# Patient Record
Sex: Female | Born: 1960
Health system: Southern US, Community
[De-identification: ages and names within clinical notes are randomized; demographics above are authoritative.]

## PROBLEM LIST (undated history)

## (undated) DIAGNOSIS — Z789 Other specified health status: Secondary | ICD-10-CM

## (undated) DIAGNOSIS — Z9889 Other specified postprocedural states: Secondary | ICD-10-CM

## (undated) DIAGNOSIS — R112 Nausea with vomiting, unspecified: Secondary | ICD-10-CM

## (undated) DIAGNOSIS — K219 Gastro-esophageal reflux disease without esophagitis: Secondary | ICD-10-CM

## (undated) HISTORY — DX: Gastro-esophageal reflux disease without esophagitis: K21.9

---

## 1988-11-03 HISTORY — PX: DIAGNOSTIC LAPAROSCOPY: SUR761

## 1999-07-25 ENCOUNTER — Other Ambulatory Visit: Admission: RE | Admit: 1999-07-25 | Discharge: 1999-07-25 | Payer: Self-pay | Admitting: Obstetrics and Gynecology

## 1999-10-01 ENCOUNTER — Other Ambulatory Visit: Admission: RE | Admit: 1999-10-01 | Discharge: 1999-10-01 | Payer: Self-pay | Admitting: Obstetrics and Gynecology

## 1999-10-01 ENCOUNTER — Encounter (INDEPENDENT_AMBULATORY_CARE_PROVIDER_SITE_OTHER): Payer: Self-pay | Admitting: Specialist

## 2000-03-03 ENCOUNTER — Other Ambulatory Visit: Admission: RE | Admit: 2000-03-03 | Discharge: 2000-03-03 | Payer: Self-pay | Admitting: Obstetrics and Gynecology

## 2000-12-30 ENCOUNTER — Other Ambulatory Visit: Admission: RE | Admit: 2000-12-30 | Discharge: 2000-12-30 | Payer: Self-pay | Admitting: Obstetrics and Gynecology

## 2001-01-14 ENCOUNTER — Ambulatory Visit (HOSPITAL_COMMUNITY): Admission: RE | Admit: 2001-01-14 | Discharge: 2001-01-14 | Payer: Self-pay | Admitting: Obstetrics and Gynecology

## 2001-01-14 ENCOUNTER — Encounter: Payer: Self-pay | Admitting: Obstetrics and Gynecology

## 2001-06-30 ENCOUNTER — Other Ambulatory Visit: Admission: RE | Admit: 2001-06-30 | Discharge: 2001-06-30 | Payer: Self-pay | Admitting: Obstetrics and Gynecology

## 2002-02-14 ENCOUNTER — Other Ambulatory Visit: Admission: RE | Admit: 2002-02-14 | Discharge: 2002-02-14 | Payer: Self-pay | Admitting: Obstetrics and Gynecology

## 2002-03-16 ENCOUNTER — Encounter: Payer: Self-pay | Admitting: Obstetrics and Gynecology

## 2002-03-16 ENCOUNTER — Ambulatory Visit (HOSPITAL_COMMUNITY): Admission: RE | Admit: 2002-03-16 | Discharge: 2002-03-16 | Payer: Self-pay | Admitting: Obstetrics and Gynecology

## 2003-03-27 ENCOUNTER — Encounter: Payer: Self-pay | Admitting: Obstetrics and Gynecology

## 2003-03-27 ENCOUNTER — Ambulatory Visit (HOSPITAL_COMMUNITY): Admission: RE | Admit: 2003-03-27 | Discharge: 2003-03-27 | Payer: Self-pay | Admitting: Obstetrics and Gynecology

## 2003-03-27 ENCOUNTER — Other Ambulatory Visit: Admission: RE | Admit: 2003-03-27 | Discharge: 2003-03-27 | Payer: Self-pay | Admitting: Obstetrics and Gynecology

## 2004-04-09 ENCOUNTER — Other Ambulatory Visit: Admission: RE | Admit: 2004-04-09 | Discharge: 2004-04-09 | Payer: Self-pay | Admitting: Obstetrics and Gynecology

## 2004-04-16 ENCOUNTER — Ambulatory Visit (HOSPITAL_COMMUNITY): Admission: RE | Admit: 2004-04-16 | Discharge: 2004-04-16 | Payer: Self-pay | Admitting: Obstetrics and Gynecology

## 2004-12-12 ENCOUNTER — Encounter: Admission: RE | Admit: 2004-12-12 | Discharge: 2004-12-12 | Payer: Self-pay | Admitting: Obstetrics and Gynecology

## 2005-01-21 ENCOUNTER — Encounter: Admission: RE | Admit: 2005-01-21 | Discharge: 2005-01-21 | Payer: Self-pay | Admitting: Obstetrics and Gynecology

## 2005-01-30 ENCOUNTER — Encounter: Admission: RE | Admit: 2005-01-30 | Discharge: 2005-01-30 | Payer: Self-pay | Admitting: Obstetrics and Gynecology

## 2005-02-27 ENCOUNTER — Encounter: Admission: RE | Admit: 2005-02-27 | Discharge: 2005-02-27 | Payer: Self-pay | Admitting: Obstetrics and Gynecology

## 2005-05-15 ENCOUNTER — Ambulatory Visit (HOSPITAL_COMMUNITY): Admission: RE | Admit: 2005-05-15 | Discharge: 2005-05-15 | Payer: Self-pay | Admitting: Obstetrics and Gynecology

## 2005-06-30 ENCOUNTER — Other Ambulatory Visit: Admission: RE | Admit: 2005-06-30 | Discharge: 2005-06-30 | Payer: Self-pay | Admitting: Obstetrics and Gynecology

## 2005-08-20 ENCOUNTER — Encounter: Admission: RE | Admit: 2005-08-20 | Discharge: 2005-08-20 | Payer: Self-pay | Admitting: *Deleted

## 2006-07-13 ENCOUNTER — Ambulatory Visit (HOSPITAL_COMMUNITY): Admission: RE | Admit: 2006-07-13 | Discharge: 2006-07-13 | Payer: Self-pay | Admitting: Obstetrics and Gynecology

## 2007-09-01 ENCOUNTER — Ambulatory Visit (HOSPITAL_COMMUNITY): Admission: RE | Admit: 2007-09-01 | Discharge: 2007-09-01 | Payer: Self-pay | Admitting: Obstetrics and Gynecology

## 2008-09-11 ENCOUNTER — Ambulatory Visit (HOSPITAL_COMMUNITY): Admission: RE | Admit: 2008-09-11 | Discharge: 2008-09-11 | Payer: Self-pay | Admitting: Obstetrics and Gynecology

## 2009-01-29 ENCOUNTER — Emergency Department (HOSPITAL_COMMUNITY): Admission: EM | Admit: 2009-01-29 | Discharge: 2009-01-29 | Payer: Self-pay | Admitting: Family Medicine

## 2009-03-23 ENCOUNTER — Ambulatory Visit: Payer: Self-pay | Admitting: Internal Medicine

## 2009-03-26 DIAGNOSIS — K219 Gastro-esophageal reflux disease without esophagitis: Secondary | ICD-10-CM | POA: Insufficient documentation

## 2009-10-12 ENCOUNTER — Ambulatory Visit (HOSPITAL_COMMUNITY): Admission: RE | Admit: 2009-10-12 | Discharge: 2009-10-12 | Payer: Self-pay | Admitting: Obstetrics and Gynecology

## 2010-10-17 ENCOUNTER — Ambulatory Visit (HOSPITAL_COMMUNITY)
Admission: RE | Admit: 2010-10-17 | Discharge: 2010-10-17 | Payer: Self-pay | Source: Home / Self Care | Attending: Obstetrics and Gynecology | Admitting: Obstetrics and Gynecology

## 2011-10-22 ENCOUNTER — Other Ambulatory Visit (HOSPITAL_COMMUNITY): Payer: Self-pay | Admitting: Obstetrics and Gynecology

## 2011-10-22 DIAGNOSIS — Z1231 Encounter for screening mammogram for malignant neoplasm of breast: Secondary | ICD-10-CM

## 2011-11-05 ENCOUNTER — Encounter (HOSPITAL_COMMUNITY): Payer: Self-pay | Admitting: Pharmacist

## 2011-11-10 NOTE — H&P (Signed)
Taylor Holloway  DICTATION # [id number] CSN# [id number]   Meriel Pica, MD 11/10/2011 1:55 PM

## 2011-11-11 NOTE — H&P (Signed)
Taylor Holloway               ACCOUNT NO.:  [id number]  MEDICAL RECORD NO.:  [id number]  LOCATION:                                 FACILITY:  PHYSICIAN:  Duke Salvia. Marcelle Holloway, M.D.DATE OF BIRTH:  [birth year]  DATE OF ADMISSION: DATE OF DISCHARGE:                             HISTORY & PHYSICAL   CHIEF COMPLAINT:  Uterine prolapse.  HISTORY OF PRESENT ILLNESS:  A 51 year old G2, P2, LMP was December 2011.  This patient has been evaluated by Dr. Sherron Monday recently for increased pelvic pressure and SUI.  He had recommended that performing A and P repair with sling, but felt that she had some degree of uterine prolapse that would necessitate hysterectomy at the same time.  She presents now for LAVH with conservation of her ovaries along with A and P repair and sling per Dr. Sherron Monday.  This procedure including risks related to bleeding, infection, adjacent organ injury, transfusion, the possible need to complete surgery by open technique.  All discussed with her, which she understands and accepts.  ALLERGIES:  None.  CURRENT MEDICATIONS:  None.  OBSTETRICAL HISTORY: 1. Diagnostic laparoscopy in 1990. 2. Vaginal deliveries.  REVIEW OF SYSTEMS:  Significant for a history of headache.  FAMILY HISTORY:  Significant for diabetes, hypertension, and heart disease.  SOCIAL HISTORY:  Denies drug or tobacco use.  Rare alcohol use.  Her medical doctor is Dr. Sanda Linger at [facility].  Last Pap, January 2012, was normal.  Previous laparoscopy was in 1990.  Pelvic endometriosis was noted at that time that was fairly minimal.  PHYSICAL EXAMINATION:  VITAL SIGNS:  Temp 98.2, blood pressure 120/78. HEENT:  Unremarkable. NECK:  Supple without masses. LUNGS:  Clear. CARDIOVASCULAR:  Regular rate and rhythm without murmurs, rubs, gallops. BREASTS:  Without masses. ABDOMEN:  Soft, flat, nontender. PELVIC:  Normal external genitalia.  Vagina and cervix clear.  Uterus, mid position,  normal size, mobile, adnexa negative.  On straining, there is first-degree prolapse. EXTREMITIES:  Unremarkable. NEUROLOGIC:  Unremarkable.  IMPRESSION: 1. Uterine prolapse, history of endometriosis. 2. Pelvic relaxation, cystocele, rectocele, stress urinary     incontinence as noted per Dr. Sherron Monday.  PLAN:  LAVH procedure and risks discussed as above Dr. Sherron Monday to perform A and P repair along with a sling.     Taylor Holloway, M.D.     RMH/MEDQ  D:  11/10/2011  T:  11/11/2011  Job:  [id number]

## 2011-11-12 ENCOUNTER — Encounter (HOSPITAL_COMMUNITY): Payer: Self-pay

## 2011-11-12 ENCOUNTER — Encounter (HOSPITAL_COMMUNITY)
Admission: RE | Admit: 2011-11-12 | Discharge: 2011-11-12 | Disposition: A | Discharge status: Home / Self Care | Payer: 59 | Source: Ambulatory Visit | Attending: Obstetrics and Gynecology | Admitting: Obstetrics and Gynecology

## 2011-11-12 HISTORY — DX: Other specified postprocedural states: Z98.890

## 2011-11-12 HISTORY — DX: Other specified health status: Z78.9

## 2011-11-12 HISTORY — DX: Nausea with vomiting, unspecified: R11.2

## 2011-11-12 LAB — CBC
HCT: 41.2 % (ref 36.0–46.0)
Hemoglobin: 14.1 g/dL (ref 12.0–15.0)
MCV: 92.4 fL (ref 78.0–100.0)
RBC: 4.46 MIL/uL (ref 3.87–5.11)
RDW: 12.7 % (ref 11.5–15.5)
WBC: 8.1 10*3/uL (ref 4.0–10.5)

## 2011-11-12 LAB — APTT: aPTT: 27 seconds (ref 24–37)

## 2011-11-12 LAB — SURGICAL PCR SCREEN: MRSA, PCR: NEGATIVE

## 2011-11-12 LAB — TYPE AND SCREEN
ABO/RH(D): A POS
Antibody Screen: NEGATIVE

## 2011-11-12 LAB — ABO/RH: ABO/RH(D): A POS

## 2011-11-12 LAB — PROTIME-INR: INR: 0.94 (ref 0.00–1.49)

## 2011-11-12 NOTE — H&P (Signed)
History of Present Illness   Taylor Holloway has mixed stress urge incontinence. In my opinion she has mild incontinence not associated with awareness, a little bit of dampness in the middle of the night and a little bit of foot-on-the-floor syndrome. She is a recovery room nurse. 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 She had a negative cystoscopy. She emptied efficiently on her first visit. On pelvic examination she had grade 2 hypermobility of the bladder neck and a positive cough test and a small grade 2 cystocele and grade 1 rectocele. From time to time she feels a little pressure in the vaginal area. I thought she would be fine just with a sling. Based upon her age, etc., consideration for prolapse surgery at the time of sling I felt was worth discussing.   Today I reviewed her urodynamics. Review of systems: No change in bowel or neurologic status.   Urinalysis: I reviewed, negative.  On urodynamics she did not void and was catheterized for 45 mL. Her maximum capacity was 356 mL. Bladder was hypersensitive. Bladder was otherwise stable. She had some pain at bladder capacity. She had severe leakage with Valsalva leak-point pressure of 18-24 cm of water. During voluntary voiding she voided 366 mL with a maximum flow of 6 mL per second. Maximum voiding pressure was 5 cm of water. She emptied efficiently. Her EMG activity was normal. Bladder neck descended 3 cm fluoroscopically. The details of the urodynamics are signed and dictated on the urodynamic sheet.   I drew Taylor Holloway a picture. Twenty-five minutes or longer was used for discussions and review. Based upon her low leak-point pressures, the majority of her incontinence is likely due to her outlet. She understands that she has an overactive bladder component but I think this is only approximately 20% of her problem. The pathophysiology of enuresis was also discussed.   I talked to her about a sling.   We talked about a sling  in detail. Pros, cons, general surgical and anesthetic risks, and other options including behavioral therapy and watchful waiting were discussed. She understands that slings are generally successful in 90% of cases for stress incontinence, 50% for urge incontinence, and that in a small percentage of cases the incontinence can worsen. The risk of persistent, de novo, or worsening incontinence/dysfunction was discussed. Risks were described but not limited to the discussion of injury to neighboring structures including the bowel (with possible life-threatening sepsis and colostomy), bladder, urethra, vagina (all resulting in further surgery), and ureter (resulting in re-implantation). We also talked about the risk of retention requiring urethrolysis, extrusion requiring revision, and erosion resulting in further surgery. Bleeding risks and transfusion rates and the risk of infection were discussed. The risk of pelvic and abdominal pain syndromes, dyspareunia, and neuropathies were discussed. The need for CIC was described as well the usual post-operative course. The patient understands that she might not reach her treatment goal and that she might be worse following surgery.   I reexamined Taylor Holloway today. She has not had a menstrual cycle for 9 months and was today. There is no question she has a moderate grade 2 cystocele that when she bore down it did come down rather far towards urethrovesical angle. She had grade 2 out of 4 perineal bulging and a mild grade 2 rectocele that I would not be surprised that she is almost feeling as well. Her uterus descended approximately 2 cm.   In addition to discussing a sling, I also talked about  prolapse.  I drew her a picture and we talked about prolapse surgery in detail. Pros, cons, general surgical and anesthetic risks, and other options including behavioral therapy, pessaries, and watchful waiting were discussed. She understands that prolapse repairs are successful  in 80-85% of cases for prolapse symptoms and can recur anteriorly, posteriorly, and/or apically. She understands that in most cases I use a graft and general risks were discussed. Surgical risks were described but not limited to the discussion of injury to neighboring structures including the bowel (with possible life-threatening sepsis and colostomy), bladder, urethra, vagina (all resulting in further surgery), and ureter (resulting in re-implantation). We talked about injury to nerves/soft tissue leading to debilitating and intractable pelvic, abdominal, and lower extremity pain syndromes and neuropathies. The risks of buttock pain, intractable dyspareunia, and vaginal narrowing and shortening with sequelae were discussed. Bleeding risks, transfusion rates, and infection were discussed. The risk of persistent, de novo, or worsening bladder and/or bowel incontinence/dysfunction was discussed. The need for CIC was described as well the usual post-operative course. The patient understands that she might not reach her treatment goal and that she might be worse following surgery.   Twenty-five minutes or longer was used for discussion.    Past Medical History Problems  1. History of  Esophageal Reflux 530.81  Surgical History Problems  1. History of  Gynecologic Laparoscopy With Adhesiolysis  Current Meds 1. Estroven TABS; Therapy: (Recorded:22Aug2012) to 2. Ibuprofen 200 MG Oral Tablet; Therapy: (Recorded:22Aug2012) to 3. Multi-Vitamin TABS; Therapy: (Recorded:22Aug2012) to  Allergies Medication  1. No Known Drug Allergies  Family History Problems  1. Paternal history of  Diabetes Mellitus V18.0 2. Maternal history of  Diabetes Mellitus V18.0 3. Family history of  Family Health Status Number Of Children 2 sons 4. Paternal history of  Hypertension V17.49 5. Maternal history of  Hypertension V17.49 6. Maternal history of  Stroke Syndrome V17.1  Social History Problems  1. Alcohol  Use rare glass of wine 2. Caffeine Use 2 cups daily 3. Marital History - Divorced V61.03 4. Occupation: Charity fundraiser 5. History of  Tobacco Use 305.1 smoked 2 cigs dailysmoked for 5 yearsquit smoking 1 year ago  Results/Data  Urine [Data Includes: Last 1 Day]  18Sep2012  COLOR: YELLOW  Reference Range YELLOW APPEARANCE: CLEAR  Reference Range CLEAR SPECIFIC GRAVITY: 1.010  Reference Range 1.005-1.030 pH: 6.0  Reference Range 5.0-8.0 GLUCOSE: NEG mg/dL Reference Range NEG BILIRUBIN: NEG  Reference Range NEG KETONE: NEG mg/dL Reference Range NEG BLOOD: NEG  Reference Range NEG PROTEIN: NEG mg/dL Reference Range NEG UROBILINOGEN: 0.2 mg/dL Reference Range 2.9-5.2 NITRITE: NEG  Reference Range NEG LEUKOCYTE ESTERASE: NEG  Reference Range NEG  Assessment Assessed  1. Cystocele 596.89 2. Urge And Stress Incontinence 788.33  Discussion/Summary   It would be very reasonable for Taylor Holloway to consider a transvaginal hysterectomy and A&P repair. She likely would not require a graft. Hopefully a sling would reach her treatment goal regarding her incontinence.   Taylor Holloway is feeling her prolapse. She wants to see Dr. Marcelle Overlie and is strongly considering a transvaginal hysterectomy with A&P repair and sling. I am going to send a copy of my note to Dr. Marcelle Overlie. I will be happy to do the prolapse and sling and he can do the hysterectomy or perhaps he wants to do some of the prolapse. It makes no difference whatsoever and Taylor Holloway and I spoke about this. I will await a phone call and scheduling from his office ____ because of  EPIC going online, Taylor Holloway may want this done earlier than the new year.   After a thorough review of the management options for the patient's condition the patient  elected to proceed with surgical therapy as noted above. We have discussed the potential benefits and risks of the procedure, side effects of the proposed treatment, the likelihood of the patient achieving the  goals of the procedure, and any potential problems that might occur during the procedure or recuperation. Informed consent has been obtained.

## 2011-11-12 NOTE — Patient Instructions (Signed)
   Your procedure is scheduled on: Thur. January 10 at 0730  Enter through the [employer] of [facility] at: 6:00 Pick up the phone at the desk and dial [phone] and inform us of your arrival.  Please call this number if you have any problems the morning of surgery: [phone]  Remember: Do not eat food after midnight: Tonight  Do not drink clear liquids after: Tonight  Do not wear jewelry, make-up, or FINGER nail polish Do not wear lotions, powders, perfumes or deodorant. Do not shave 48 hours prior to surgery. Do not bring valuables to the hospital.  Leave suitcase in the car. After Surgery it may be brought to your room. For patients being admitted to the hospital, checkout time is 11:00am the day of discharge.  Remember to use your hibiclens as instructed.Please shower with 1/2 bottle the evening before your surgery and the other 1/2 bottle the morning of surgery.

## 2011-11-13 ENCOUNTER — Encounter (HOSPITAL_COMMUNITY): Payer: Self-pay | Admitting: Registered Nurse

## 2011-11-13 ENCOUNTER — Ambulatory Visit (HOSPITAL_COMMUNITY): Payer: 59 | Admitting: Registered Nurse

## 2011-11-13 ENCOUNTER — Encounter (HOSPITAL_COMMUNITY): Payer: Self-pay | Admitting: *Deleted

## 2011-11-13 ENCOUNTER — Inpatient Hospital Stay (HOSPITAL_COMMUNITY)
Admission: RE | Admit: 2011-11-13 | Discharge: 2011-11-15 | DRG: 743 | Disposition: A | Discharge status: Home / Self Care | Payer: 59 | Source: Ambulatory Visit | Attending: Obstetrics and Gynecology | Admitting: Obstetrics and Gynecology

## 2011-11-13 ENCOUNTER — Other Ambulatory Visit: Payer: Self-pay | Admitting: Obstetrics and Gynecology

## 2011-11-13 ENCOUNTER — Encounter (HOSPITAL_COMMUNITY)
Admission: RE | Disposition: A | Discharge status: Home / Self Care | Payer: Self-pay | Source: Ambulatory Visit | Attending: Obstetrics and Gynecology

## 2011-11-13 DIAGNOSIS — Z5331 Laparoscopic surgical procedure converted to open procedure: Secondary | ICD-10-CM

## 2011-11-13 DIAGNOSIS — N8000 Endometriosis of the uterus, unspecified: Secondary | ICD-10-CM | POA: Diagnosis present

## 2011-11-13 DIAGNOSIS — Z01812 Encounter for preprocedural laboratory examination: Secondary | ICD-10-CM

## 2011-11-13 DIAGNOSIS — K219 Gastro-esophageal reflux disease without esophagitis: Secondary | ICD-10-CM | POA: Diagnosis present

## 2011-11-13 DIAGNOSIS — N812 Incomplete uterovaginal prolapse: Principal | ICD-10-CM | POA: Diagnosis present

## 2011-11-13 DIAGNOSIS — N3946 Mixed incontinence: Secondary | ICD-10-CM | POA: Diagnosis present

## 2011-11-13 DIAGNOSIS — Z01818 Encounter for other preprocedural examination: Secondary | ICD-10-CM

## 2011-11-13 HISTORY — PX: ANTERIOR AND POSTERIOR REPAIR: SHX5121

## 2011-11-13 HISTORY — PX: ABDOMINAL HYSTERECTOMY: SHX81

## 2011-11-13 HISTORY — PX: LAPAROSCOPIC ASSISTED VAGINAL HYSTERECTOMY: SHX5398

## 2011-11-13 HISTORY — PX: PUBOVAGINAL SLING: SHX1035

## 2011-11-13 LAB — PREGNANCY, URINE: Preg Test, Ur: NEGATIVE

## 2011-11-13 SURGERY — HYSTERECTOMY, VAGINAL, LAPAROSCOPY-ASSISTED
Anesthesia: General | Site: Abdomen | Wound class: Clean Contaminated

## 2011-11-13 MED ORDER — NEOSTIGMINE METHYLSULFATE 1 MG/ML IJ SOLN
INTRAMUSCULAR | Status: DC | PRN
  Administered 2011-11-13: 1 mg via INTRAVENOUS

## 2011-11-13 MED ORDER — KETOROLAC TROMETHAMINE 30 MG/ML IJ SOLN: 15.0000 mg | Freq: Once | INTRAMUSCULAR | Status: DC | PRN

## 2011-11-13 MED ORDER — SCOPOLAMINE 1 MG/3DAYS TD PT72
1.0000 | MEDICATED_PATCH | Freq: Once | TRANSDERMAL | Status: DC | PRN
  Administered 2011-11-13: 1.5 mg via TRANSDERMAL

## 2011-11-13 MED ORDER — HYDROMORPHONE HCL PF 1 MG/ML IJ SOLN
INTRAMUSCULAR | Status: AC
  Administered 2011-11-13: 0.5 mg via INTRAVENOUS
  Filled 2011-11-13: qty 1

## 2011-11-13 MED ORDER — ONDANSETRON HCL 4 MG/2ML IJ SOLN: 4.0000 mg | Freq: Four times a day (QID) | INTRAMUSCULAR | Status: DC | PRN

## 2011-11-13 MED ORDER — MENTHOL 3 MG MT LOZG: 1.0000 | LOZENGE | OROMUCOSAL | Status: DC | PRN

## 2011-11-13 MED ORDER — BUPIVACAINE 0.5 % ON-Q PUMP DUAL CATH 300 ML
INJECTION | Status: DC | PRN
  Administered 2011-11-13: 300 mL

## 2011-11-13 MED ORDER — ROCURONIUM BROMIDE 100 MG/10ML IV SOLN
INTRAVENOUS | Status: DC | PRN
  Administered 2011-11-13: 50 mg via INTRAVENOUS

## 2011-11-13 MED ORDER — GENTAMICIN IN SALINE 0.8-0.9 MG/ML-% IV SOLN
80.0000 mg | Freq: Once | INTRAVENOUS | Status: DC
  Filled 2011-11-13: qty 100

## 2011-11-13 MED ORDER — CEFAZOLIN SODIUM 1-5 GM-% IV SOLN
INTRAVENOUS | Status: AC
  Filled 2011-11-13: qty 50

## 2011-11-13 MED ORDER — NEOSTIGMINE METHYLSULFATE 1 MG/ML IJ SOLN
INTRAMUSCULAR | Status: AC
  Filled 2011-11-13: qty 10

## 2011-11-13 MED ORDER — DEXTROSE IN LACTATED RINGERS 5 % IV SOLN
INTRAVENOUS | Status: DC
  Administered 2011-11-13 – 2011-11-14 (×2): via INTRAVENOUS

## 2011-11-13 MED ORDER — LIDOCAINE HCL (CARDIAC) 20 MG/ML IV SOLN
INTRAVENOUS | Status: DC | PRN
  Administered 2011-11-13: 60 mg via INTRAVENOUS

## 2011-11-13 MED ORDER — GENTAMICIN IN SALINE 1.6-0.9 MG/ML-% IV SOLN
80.0000 mg | Freq: Once | INTRAVENOUS | Status: DC
  Administered 2011-11-13: 80 mg via INTRAVENOUS
  Filled 2011-11-13: qty 50

## 2011-11-13 MED ORDER — HYDROMORPHONE HCL PF 1 MG/ML IJ SOLN
INTRAMUSCULAR | Status: AC
  Filled 2011-11-13: qty 1

## 2011-11-13 MED ORDER — ESTRADIOL 0.1 MG/GM VA CREA
TOPICAL_CREAM | VAGINAL | Status: DC | PRN
  Administered 2011-11-13: 2 via VAGINAL

## 2011-11-13 MED ORDER — SODIUM CHLORIDE 0.9 % IJ SOLN: 9.0000 mL | INTRAMUSCULAR | Status: DC | PRN

## 2011-11-13 MED ORDER — CEFAZOLIN SODIUM 1-5 GM-% IV SOLN
1.0000 g | INTRAVENOUS | Status: AC
  Administered 2011-11-13: 1 g via INTRAVENOUS

## 2011-11-13 MED ORDER — ROCURONIUM BROMIDE 50 MG/5ML IV SOLN
INTRAVENOUS | Status: AC
  Filled 2011-11-13: qty 1

## 2011-11-13 MED ORDER — BUPIVACAINE HCL (PF) 0.25 % IJ SOLN
INTRAMUSCULAR | Status: DC | PRN
  Administered 2011-11-13: 8 mL

## 2011-11-13 MED ORDER — FENTANYL CITRATE 0.05 MG/ML IJ SOLN
INTRAMUSCULAR | Status: DC | PRN
  Administered 2011-11-13 (×2): 100 ug via INTRAVENOUS
  Administered 2011-11-13: 50 ug via INTRAVENOUS

## 2011-11-13 MED ORDER — DEXAMETHASONE SODIUM PHOSPHATE 10 MG/ML IJ SOLN
INTRAMUSCULAR | Status: DC | PRN
  Administered 2011-11-13: 10 mg via INTRAVENOUS

## 2011-11-13 MED ORDER — ZOLPIDEM TARTRATE 5 MG PO TABS
5.0000 mg | ORAL_TABLET | Freq: Every evening | ORAL | Status: DC | PRN
Start: 2011-11-13 — End: 2011-11-15

## 2011-11-13 MED ORDER — SODIUM CHLORIDE 0.9 % IR SOLN
Freq: Once | Status: DC
  Filled 2011-11-13: qty 500

## 2011-11-13 MED ORDER — SCOPOLAMINE 1 MG/3DAYS TD PT72
MEDICATED_PATCH | TRANSDERMAL | Status: AC
  Administered 2011-11-13: 1.5 mg via TRANSDERMAL
  Filled 2011-11-13: qty 1

## 2011-11-13 MED ORDER — HYDROMORPHONE HCL PF 1 MG/ML IJ SOLN
INTRAMUSCULAR | Status: DC | PRN
  Administered 2011-11-13 (×2): 0.5 mg via INTRAVENOUS

## 2011-11-13 MED ORDER — MIDAZOLAM HCL 2 MG/2ML IJ SOLN
INTRAMUSCULAR | Status: AC
  Filled 2011-11-13: qty 2

## 2011-11-13 MED ORDER — IBUPROFEN 800 MG PO TABS
800.0000 mg | ORAL_TABLET | Freq: Three times a day (TID) | ORAL | Status: DC | PRN
  Administered 2011-11-14 – 2011-11-15 (×2): 800 mg via ORAL
  Filled 2011-11-13 (×2): qty 1

## 2011-11-13 MED ORDER — HYDROMORPHONE HCL PF 1 MG/ML IJ SOLN
0.2500 mg | INTRAMUSCULAR | Status: DC | PRN
  Administered 2011-11-13 (×4): 0.5 mg via INTRAVENOUS

## 2011-11-13 MED ORDER — CIPROFLOXACIN IN D5W 400 MG/200ML IV SOLN
400.0000 mg | Freq: Two times a day (BID) | INTRAVENOUS | Status: AC
  Administered 2011-11-13: 400 mg via INTRAVENOUS
  Filled 2011-11-13 (×2): qty 200

## 2011-11-13 MED ORDER — MIDAZOLAM HCL 5 MG/5ML IJ SOLN
INTRAMUSCULAR | Status: DC | PRN
  Administered 2011-11-13: 2 mg via INTRAVENOUS

## 2011-11-13 MED ORDER — SODIUM CHLORIDE 0.9 % IR SOLN
Status: DC | PRN
  Administered 2011-11-13: 09:00:00

## 2011-11-13 MED ORDER — LACTATED RINGERS IV SOLN
INTRAVENOUS | Status: DC
  Administered 2011-11-13 (×4): via INTRAVENOUS

## 2011-11-13 MED ORDER — NALOXONE HCL 0.4 MG/ML IJ SOLN: 0.4000 mg | INTRAMUSCULAR | Status: DC | PRN

## 2011-11-13 MED ORDER — INDIGOTINDISULFONATE SODIUM 8 MG/ML IJ SOLN
INTRAMUSCULAR | Status: DC | PRN
  Administered 2011-11-13: 5 mL via INTRAVENOUS

## 2011-11-13 MED ORDER — GLYCOPYRROLATE 0.2 MG/ML IJ SOLN
INTRAMUSCULAR | Status: DC | PRN
  Administered 2011-11-13: 0.2 mg via INTRAVENOUS

## 2011-11-13 MED ORDER — MORPHINE SULFATE (PF) 1 MG/ML IV SOLN
INTRAVENOUS | Status: DC
  Administered 2011-11-13: 7 mg via INTRAVENOUS
  Administered 2011-11-13: 13:00:00 via INTRAVENOUS
  Administered 2011-11-13: 3 mg via INTRAVENOUS
  Administered 2011-11-13: 6 mg via INTRAVENOUS
  Administered 2011-11-14: 1 mL via INTRAVENOUS
  Administered 2011-11-14: 4 mg via INTRAVENOUS
  Administered 2011-11-14: 3 mg via INTRAVENOUS
  Filled 2011-11-13: qty 25

## 2011-11-13 MED ORDER — ONDANSETRON HCL 4 MG/2ML IJ SOLN
INTRAMUSCULAR | Status: DC | PRN
  Administered 2011-11-13: 4 mg via INTRAVENOUS

## 2011-11-13 MED ORDER — PROPOFOL 10 MG/ML IV EMUL
INTRAVENOUS | Status: AC
  Filled 2011-11-13: qty 20

## 2011-11-13 MED ORDER — GLYCOPYRROLATE 0.2 MG/ML IJ SOLN
INTRAMUSCULAR | Status: AC
  Filled 2011-11-13: qty 1

## 2011-11-13 MED ORDER — LIDOCAINE HCL (CARDIAC) 20 MG/ML IV SOLN
INTRAVENOUS | Status: AC
  Filled 2011-11-13: qty 5

## 2011-11-13 MED ORDER — DEXAMETHASONE SODIUM PHOSPHATE 10 MG/ML IJ SOLN
INTRAMUSCULAR | Status: AC
  Filled 2011-11-13: qty 1

## 2011-11-13 MED ORDER — LIDOCAINE-EPINEPHRINE (PF) 1 %-1:200000 IJ SOLN
INTRAMUSCULAR | Status: DC | PRN
  Administered 2011-11-13: 15 mL

## 2011-11-13 MED ORDER — DIPHENHYDRAMINE HCL 12.5 MG/5ML PO ELIX: 12.5000 mg | ORAL_SOLUTION | Freq: Four times a day (QID) | ORAL | Status: DC | PRN

## 2011-11-13 MED ORDER — DIPHENHYDRAMINE HCL 50 MG/ML IJ SOLN: 12.5000 mg | Freq: Four times a day (QID) | INTRAMUSCULAR | Status: DC | PRN

## 2011-11-13 MED ORDER — PROPOFOL 10 MG/ML IV EMUL
INTRAVENOUS | Status: DC | PRN
  Administered 2011-11-13: 170 mg via INTRAVENOUS

## 2011-11-13 MED ORDER — ONDANSETRON HCL 4 MG/2ML IJ SOLN
INTRAMUSCULAR | Status: AC
  Filled 2011-11-13: qty 2

## 2011-11-13 MED ORDER — FENTANYL CITRATE 0.05 MG/ML IJ SOLN
INTRAMUSCULAR | Status: AC
  Filled 2011-11-13: qty 5

## 2011-11-13 SURGICAL SUPPLY — 77 items
BLADE SURG 15 STRL LF C SS BP (BLADE) ×3 IMPLANT
BLADE SURG 15 STRL SS (BLADE) ×1
CABLE HIGH FREQUENCY MONO STRZ (ELECTRODE) IMPLANT
CANISTER SUCTION 2500CC (MISCELLANEOUS) ×4 IMPLANT
CATH FOLEY 2WAY SLVR  5CC 16FR (CATHETERS) ×1
CATH FOLEY 2WAY SLVR 5CC 16FR (CATHETERS) ×3 IMPLANT
CATH ROBINSON RED A/P 16FR (CATHETERS) ×4 IMPLANT
CLOTH BEACON ORANGE TIMEOUT ST (SAFETY) ×4 IMPLANT
CONT PATH 16OZ SNAP LID 3702 (MISCELLANEOUS) ×4 IMPLANT
COVER MAYO STAND STRL (DRAPES) ×4 IMPLANT
COVER TABLE BACK 60X90 (DRAPES) ×4 IMPLANT
DECANTER SPIKE VIAL GLASS SM (MISCELLANEOUS) IMPLANT
DERMABOND ADVANCED (GAUZE/BANDAGES/DRESSINGS) ×1
DERMABOND ADVANCED .7 DNX12 (GAUZE/BANDAGES/DRESSINGS) ×3 IMPLANT
DEVICE CAPIO SUTURING (INSTRUMENTS)
DEVICE CAPIO SUTURING OPC (INSTRUMENTS) IMPLANT
DRAIN PENROSE 1/4X12 LTX (DRAIN) ×4 IMPLANT
DRAPE CAMERA CLOSED 9X96 (DRAPES) IMPLANT
DRAPE UNDERBUTTOCKS STRL (DRAPE) ×4 IMPLANT
DRSG COVADERM 4X6 (GAUZE/BANDAGES/DRESSINGS) ×4 IMPLANT
ELECT LIGASURE LONG (ELECTRODE) IMPLANT
ELECT REM PT RETURN 9FT ADLT (ELECTROSURGICAL) ×4
ELECTRODE REM PT RTRN 9FT ADLT (ELECTROSURGICAL) ×3 IMPLANT
GAUZE PACKING 1 X5 YD ST (GAUZE/BANDAGES/DRESSINGS) IMPLANT
GAUZE PACKING 2X5 YD STERILE (GAUZE/BANDAGES/DRESSINGS) ×4 IMPLANT
GAUZE SPONGE 4X4 16PLY XRAY LF (GAUZE/BANDAGES/DRESSINGS) ×8 IMPLANT
GLOVE BIO SURGEON STRL SZ7 (GLOVE) ×12 IMPLANT
GLOVE BIO SURGEON STRL SZ7.5 (GLOVE) ×8 IMPLANT
GLOVE BIOGEL PI IND STRL 6.5 (GLOVE) ×3 IMPLANT
GLOVE BIOGEL PI INDICATOR 6.5 (GLOVE) ×1
GOWN PREVENTION PLUS LG XLONG (DISPOSABLE) ×32 IMPLANT
NEEDLE INSUFFLATION 14GA 120MM (NEEDLE) ×4 IMPLANT
NEEDLE MAYO 6 CRC TAPER PT (NEEDLE) ×4 IMPLANT
NEEDLE SPNL 22GX3.5 QUINCKE BK (NEEDLE) IMPLANT
NS IRRIG 1000ML POUR BTL (IV SOLUTION) ×8 IMPLANT
PACK LAVH (CUSTOM PROCEDURE TRAY) ×4 IMPLANT
PACK VAGINAL WOMENS (CUSTOM PROCEDURE TRAY) IMPLANT
PENCIL BUTTON HOLSTER BLD 10FT (ELECTRODE) ×4 IMPLANT
PLUG CATH AND CAP STER (CATHETERS) ×4 IMPLANT
RETRACTOR STAY HOOK 5MM (MISCELLANEOUS) ×4 IMPLANT
SCISSORS LAP 5X45 EPIX DISP (ENDOMECHANICALS) ×4 IMPLANT
SEALER TISSUE G2 CVD JAW 45CM (ENDOMECHANICALS) ×8 IMPLANT
SET CYSTO W/LG BORE CLAMP LF (SET/KITS/TRAYS/PACK) ×4 IMPLANT
SET IRRIG TUBING LAPAROSCOPIC (IRRIGATION / IRRIGATOR) IMPLANT
SHEET LAVH (DRAPES) ×4 IMPLANT
SLING SYSTEM SPARC (Sling) ×4 IMPLANT
SOLUTION ELECTROLUBE (MISCELLANEOUS) IMPLANT
SPONGE GAUZE 2X2 8PLY STRL LF (GAUZE/BANDAGES/DRESSINGS) ×4 IMPLANT
SPONGE LAP 18X18 X RAY DECT (DISPOSABLE) ×4 IMPLANT
STRIP CLOSURE SKIN 1/4X3 (GAUZE/BANDAGES/DRESSINGS) IMPLANT
SUT CAPIO ETHIBPND (SUTURE) IMPLANT
SUT MON AB 2-0 CT1 36 (SUTURE) ×8 IMPLANT
SUT MON AB 4-0 PS1 27 (SUTURE) ×4 IMPLANT
SUT SILK 2 0 FS (SUTURE) IMPLANT
SUT VIC AB 0 CT1 18XCR BRD8 (SUTURE) ×12 IMPLANT
SUT VIC AB 0 CT1 27 (SUTURE) ×5
SUT VIC AB 0 CT1 27XBRD ANBCTR (SUTURE) ×15 IMPLANT
SUT VIC AB 0 CT1 8-18 (SUTURE) ×4
SUT VIC AB 0 CT2 27 (SUTURE) IMPLANT
SUT VIC AB 2-0 CT1 (SUTURE) ×12 IMPLANT
SUT VIC AB 2-0 CT1 27 (SUTURE) ×3
SUT VIC AB 2-0 CT1 TAPERPNT 27 (SUTURE) ×9 IMPLANT
SUT VIC AB 2-0 CT2 27 (SUTURE) IMPLANT
SUT VIC AB 2-0 SH 27 (SUTURE) ×4
SUT VIC AB 2-0 SH 27XBRD (SUTURE) ×12 IMPLANT
SUT VIC AB 4-0 PS2 27 (SUTURE) ×4 IMPLANT
SUT VICRYL 0 TIES 12 18 (SUTURE) ×4 IMPLANT
SUT VICRYL 0 UR6 27IN ABS (SUTURE) IMPLANT
SUT VICRYL 4-0 PS2 18IN ABS (SUTURE) ×4 IMPLANT
TOWEL OR 17X24 6PK STRL BLUE (TOWEL DISPOSABLE) ×16 IMPLANT
TRAY FOLEY CATH 14FR (SET/KITS/TRAYS/PACK) ×4 IMPLANT
TROCAR Z-THREAD BLADED 11X100M (TROCAR) ×4 IMPLANT
TROCAR Z-THREAD BLADED 5X100MM (TROCAR) ×8 IMPLANT
TUBING CONNECTING 10 (TUBING) ×4 IMPLANT
TUBING NON-CON 1/4 X 20 CONN (TUBING) ×4 IMPLANT
WARMER LAPAROSCOPE (MISCELLANEOUS) ×4 IMPLANT
WATER STERILE IRR 1000ML POUR (IV SOLUTION) ×8 IMPLANT

## 2011-11-13 NOTE — OR Nursing (Signed)
Abdominal procedure completed at 0854, counts are correct. Dr Sherron Monday started at [phone]

## 2011-11-13 NOTE — Op Note (Signed)
Preoperative diagnosis: Stress urinary incontinence; small cystocele; rectocele Postoperative diagnosis: Stress urinary incontinence; small cystocele; rectocele Surgery: Rectocele repair plus sling [facility]) plus cystoscopy Surgeon Taylor Holloway Assistant for rectocele: Taylor Holloway. [name]  Taylor Holloway had mildly symptomatic prolapse with a small cystocele, this to a rectocele and grade 2/4 perineal bulging with elastic tissues. The same findings were intraoperative. She underwent a transabdominal hysterectomy by Taylor Holloway and and his team. The vaginal cuff was closed and I entered the room.  She was given preoperative antibiotics. Preoperative laboratory tests were normal. Extra care was taken with leg positioning to minimize the risk of compartment syndrome neuropathy and DVT  Following a hysterectomy she virtually had no cystocele. She in very good vaginal length. She had perineal bulging with a grade 2 rectocele with did a rectal examination. Her tissues were very elastic  I instilled approximately 15 cc of a lidocaine epinephrine mixture posteriorly and submucosally. Between 2 Allis clamps at 5 and 7:00 on the hymenal ring I removed a small triangle of perineal skin. With my usual technique I made a long posterior vaginal wall incision and sharply dissected the modestly thin vaginal wall mucosa from the recently thick rectal vaginal fascia.. Vaginal length was surprisingly long posteriorly and her tissues were very elastic once again. I sharply and bluntly dissected to the sidewall bilaterally. On her right side there was bleeding easily controlled with 2 interrupted 3-0 Vicryl sutures.  Rectal examination identified diffuse thinning and there really wasn't any site defect even at the apex. I did a 2 layer imbrication. I did to rectal examinations and there is no injury to the rectum and very good support posteriorly. I did not feel that she needed a graft  I trimmed appropriately the amount of  vaginal wall mucosa posteriorly and closed the posterior vaginal wall with running 2-0 Vicryl on a CT1 needle. I did to gentle perineal imbricating sutures of 0 Vicryl and closed the perineum with a running 2-0 Vicryl.  I then did the sling  Two less than 1 cm incisions were made 1 fingerbreadth above the symphysis pubis 1.5 cm lateral to the midline. A 2 cm appropriate depth suburethral incision was made underneath the mid urethra after instilling approximately 5 cc of 1% lidocaine mixture. I sharply and bluntly dissected to the urethral vesical angle bilaterally.  With the bladder empty I passed a SPARC needle on top of and along the back of the symphysis pubis staying  on the periosteum and staying lateral using my box technique and delivering the needle onto the pulp of my  index finger bilaterally.  I cystoscoped the patient thoroughly and there was no injury to the bladder or urethra. There was no movement  or indentation of the bladder with movement of the trocar. There was excellent efflux of blue urine bilaterally.  With the bladder emptied I attached the [facility] sling and brought it up through the retropubic space bilaterally. I tensioned it over the fat part of a moderate size Kelly clamp. I cut below the blue dots, irrigated the sheaths, and removed the sheaths. I was very happy with the position and tension of the sling With appropriate hypermobilityand no springback effect.  All incisions were irrigated. The sling was cut below the skin level. I closed the anterior vaginal wall with  running 2-0 Vicryl followed by my interrupted sutures. Interrupted 4-0 Vicryl was used for the abdominal incisions.  She had minimal oozing and vaginal pack was inserted. I used the  small part of a second pack to make certain there was good pressure in the vagina as well as at the level of the urethrovesical angle. Leg position was good at in the case. Blood loss for rectocele repair and sling was 100  cc. Urine output was good.  The patient was taken to the recovery room and hopefully this procedure will reach her treatment goal.

## 2011-11-13 NOTE — Preoperative (Signed)
Beta Blockers   Reason not to administer Beta Blockers:Not Applicable 

## 2011-11-13 NOTE — Anesthesia Preprocedure Evaluation (Signed)
Anesthesia Evaluation  Patient identified by MRN, date of birth, ID band Patient awake    Reviewed: Allergy & Precautions, H&P , NPO status , Patient's Chart, lab work & pertinent test results, reviewed documented beta blocker date and time   History of Anesthesia Complications (+) PONV  Airway Mallampati: II TM Distance: <3 FB Neck ROM: full    Dental  (+) Teeth Intact   Pulmonary former smoker (quit 6 months ago) clear to auscultation  Pulmonary exam normal       Cardiovascular Exercise Tolerance: Good neg cardio ROS regular Normal    Neuro/Psych Negative Neurological ROS  Negative Psych ROS   GI/Hepatic negative GI ROS, Neg liver ROS,   Endo/Other  Negative Endocrine ROS  Renal/GU negative Renal ROS  Genitourinary negative   Musculoskeletal   Abdominal   Peds  Hematology negative hematology ROS (+)   Anesthesia Other Findings   Reproductive/Obstetrics negative OB ROS                           Anesthesia Physical Anesthesia Plan  ASA: I  Anesthesia Plan: General ETT   Post-op Pain Management:    Induction:   Airway Management Planned:   Additional Equipment:   Intra-op Plan:   Post-operative Plan:   Informed Consent: I have reviewed the patients History and Physical, chart, labs and discussed the procedure including the risks, benefits and alternatives for the proposed anesthesia with the patient or authorized representative who has indicated his/her understanding and acceptance.   Dental Advisory Given  Plan Discussed with: CRNA and Surgeon  Anesthesia Plan Comments:         Anesthesia Quick Evaluation

## 2011-11-13 NOTE — Interval H&P Note (Signed)
History and Physical Interval Note:  11/13/2011 7:03 AM  Taylor Holloway  has presented today for surgery, with the diagnosis of uterine prolapse, history of endometriosis  The various methods of treatment have been discussed with the patient and family. After consideration of risks, benefits and other options for treatment, the patient has consented to  Procedure(s): LAPAROSCOPIC ASSISTED VAGINAL HYSTERECTOMY ANTERIOR (CYSTOCELE) AND POSTERIOR REPAIR (RECTOCELE) PUBO-VAGINAL SLING as a surgical intervention .  The patients' history has been reviewed, patient examined, no change in status, stable for surgery.  I have reviewed the patients' chart and labs.  Questions were answered to the patient's satisfaction.     Jeidy Hoerner A

## 2011-11-13 NOTE — Anesthesia Postprocedure Evaluation (Signed)
  Anesthesia Post-op Note  Patient: Taylor Holloway  Procedure(s) Performed:  LAPAROSCOPIC ASSISTED VAGINAL HYSTERECTOMY - attempted; ANTERIOR (CYSTOCELE) AND POSTERIOR REPAIR (RECTOCELE); PUBO-VAGINAL SLING - sparc sling; HYSTERECTOMY ABDOMINAL - abdominal hysterectomy with left salpingo-ophoorectomy  Patient Location: [facility] Unit  Anesthesia Type: General  Level of Consciousness: awake, alert  and oriented  Airway and Oxygen Therapy: Patient Spontanous Breathing  Post-op Pain: none  Post-op Assessment: Post-op Vital signs reviewed, Patient's Cardiovascular Status Stable, Respiratory Function Stable, No signs of Nausea or vomiting and Pain level controlled  Post-op Vital Signs: reviewed and stable  Complications: No apparent anesthesia complications

## 2011-11-13 NOTE — Transfer of Care (Signed)
Immediate Anesthesia Transfer of Care Note  Patient: Taylor Holloway  Procedure(s) Performed:  LAPAROSCOPIC ASSISTED VAGINAL HYSTERECTOMY - attempted; ANTERIOR (CYSTOCELE) AND POSTERIOR REPAIR (RECTOCELE); PUBO-VAGINAL SLING - sparc sling; HYSTERECTOMY ABDOMINAL - abdominal hysterectomy with left salpingo-ophoorectomy  Patient Location: PACU  Anesthesia Type: General  Level of Consciousness: awake, alert  and oriented  Airway & Oxygen Therapy: Patient Spontanous Breathing. Oxygen per [place]  Post-op Assessment: Report given to PACU RN and Post -op Vital signs reviewed and stable  Post vital signs: Reviewed Filed Vitals:   11/13/11 0612  BP: 99/66  Pulse: 60  Temp: 36.6 C  Resp: 16    Complications: No apparent anesthesia complications

## 2011-11-13 NOTE — Progress Notes (Signed)
Alert + conversant VSS, afeb, clear, adeq UOP, Incs C/D Surgery explained

## 2011-11-13 NOTE — Progress Notes (Signed)
The patient was re-examined with no change in status 

## 2011-11-13 NOTE — Brief Op Note (Signed)
11/13/2011  9:52 AM  PATIENT:  Taylor Holloway  51 y.o. female  PRE-OPERATIVE DIAGNOSIS:  uterine prolapse, history of endometriosis  POST-OPERATIVE DIAGNOSIS:  uterine prolapse, history of endometrosis  PROCEDURE:  Procedure(s): LAPAROSCOPIC ASSISTED VAGINAL HYSTERECTOMY ANTERIOR (CYSTOCELE) AND POSTERIOR REPAIR (RECTOCELE) PUBO-VAGINAL SLING HYSTERECTOMY ABDOMINAL  SURGEON:  Surgeon(s): Martina Sinner, MD Meriel Pica, MD Roselle Locus II  PHYSICIAN ASSISTANT:   ASSISTANTS: tomblin   ANESTHESIA:   general  EBL:  Total I/O In: 2000 [I.V.:2000] Out: 200 [Blood:200]  BLOOD ADMINISTERED:none  DRAINS: Urinary Catheter (Foley)   LOCAL MEDICATIONS USED:  MARCAINE 10CC  SPECIMEN:  Source of Specimen:  uterus, l tube and ovary  DISPOSITION OF SPECIMEN:  PATHOLOGY  COUNTS:  YES  TOURNIQUET:  * No tourniquets in log *  DICTATION: .Dragon Dictation  PLAN OF CARE: Admit to inpatient   PATIENT DISPOSITION:  PACU - hemodynamically stable.   Delay start of Pharmacological VTE agent (>24hrs) due to surgical blood loss or risk of bleeding:  {YES/NO/NOT APPLICABLE:20182  Preoperative diagnosis history of endometriosis, uterine descensus, pelvic relaxation, stress urinary incontinence, cystocele and rectocele  Postoperative diagnosis: Same  Procedure: Diagnostic laparoscopy followed by laparotomy with TAH LSO, lysis of adhesions, rectocele repair and sling per Dr. Jacquelyne Balint dictated separately.  Surgeon: Marcelle Overlie  Assistant: Huntley Dec  EBL: 200 cc for the hysterectomy portion  Specimens removed: Uterus left tube and ovary, to pathology  Drains: Foley catheter  Procedure and findings:  Patient was taken to the operating room, after an adequate level of general anesthesia was obtained with the legs in stirrups the abdomen perineum and vagina were prepped and draped in the normal fashion for LAVH. The bladder was drained with in and out Foley catheter, he UA  carried out uterus is midposition normal size mobile adnexa negative. Attention directed to the abdomen after Holter tenaculum was positioned. The subumbilical area was infiltrated with quarter percent Marcaine plain small incision was made in the varies needle was introduced without difficulty. Its intra-abdominal position was verified by pressure and water testing. After 2-1/2 L pneumoperitoneum syncopated lap scopic trocar and sleeve were then introduced that difficulty. There was no evidence of any bleeding or trauma 3 finger breaths above the symphysis in the midline a 5 mm trocar was inserted under direct visualization. The patient was then placed in Trendelenburg and the pelvic findings as follows  Uterus itself was normal size right tube and ovary unremarkable the upper abdomen was negative there was sigmoid colon densely adherent to the left uterosacral ligament and the left lower portion of the posterior wall of the uterus also this was occluding good visualization of the left tube and every which were also adherent to the left pelvic sidewall. Due to the density of these adhesions decision made to proceed with open technique. Entrance removed gas escape because closed for Vicryl subcuticular sutures. Foley catheter was positioned the legs were placed more supine. Transverse Pfannenstiel incision made at that point carried down to the fascia which was incised and extended transversely. Rectus muscle divided in the midline peritoneum entered superiorly without incident and extended in a vertical fashion. O'Connor-O'Sullivan retractor was positioned Valls packs fairly out of the field long Kelly clamps were then placed at each utero-ovarian pedicle. Starting on the left the round ligament was clamped divided and suture ligated with 0 Vicryl suture the retroperitoneal space on that side was divided using careful sharp dissection the adhesions between: And left uterosacral ligament were dissected with great  care, staying on the uterine side until this was well below the insertion of the uterosacral ligament on the left after this was completed a Babcock clamps used to grasp the left tube and every which were placed on traction for the midline)on the left side was developed in the course of the ureter on the left was noted to be well below. The left IP ligament was then isolated clamped divided first free tie followed by suture ligature of Vicryl. The remaining portion of the left tube and ovary were dissected free. The anterior peritoneum was carried around to the midline. Attention directed to the right side. The right round ligament was clamped divided and suture ligated. The right utero-ovarian pedicle was clamped divided first free tie followed by suture ligature of Vicryl thus conserving the right tube and ovary. Once this was completed the peritoneum was carried around to meet in the midline and the bladder was bluntly and sharply dissected below. Uterine vascular pedicles on either side were then skeletonized clamped divided and suture ligated with 0 Vicryl in sequential manner psychosis the uterus assuring that the bladder was well below the cardinal ligament, uterosacral ligament a cervical vaginal pedicles were clamped divided and suture ligated with 0 Vicryl suture on the left side in particular great care was given to sure that the dissection of the colon was well below the operative site. Vaginal cuff was closed with interrupted 2-0 Vicryl sutures. The pelvis is irrigated with saline and aspirated and noted be hemostatic the area: Dissection was inspected carefully and noted to be intact and hemostatic. Prior to closure sponge denies precast reported as correct x2. Enclose a running 2-0 Vicryl suture. 2-0 Vicryl interrupted sutures used to close the rectus muscles in the midline fascia closed laterally to midline on either side with 0 PDS suture. Prior to complete closure the fascia a separate six-inch  needle introducer was then used to introduce 80 On-Q catheter into the subfascial and sequentially into the subcutaneous space through 2 separate sites. Remainder the fascia was closed subcutaneous tissue was irrigated noted to be hemostatic 4-0 Monocryl subcuticular suture used to close the skin the On-Q pumps were then loaded and primed with 1% Xylocaine 4 cc at each site. Sterile dressing was applied the vaginal portion the procedure dictated separately per Dr. Jacquelyne Balint was started at that point patient stable at this point in the surgery  Dictated with [employer] Izzabella Besse M. Milana Obey.D.

## 2011-11-13 NOTE — Addendum Note (Signed)
Addendum  created 11/13/11 1725 by Madison Hickman   Modules edited:Notes Section

## 2011-11-13 NOTE — Anesthesia Postprocedure Evaluation (Signed)
  Anesthesia Post-op Note  Patient: Taylor Holloway  Procedure(s) Performed:  LAPAROSCOPIC ASSISTED VAGINAL HYSTERECTOMY - attempted; ANTERIOR (CYSTOCELE) AND POSTERIOR REPAIR (RECTOCELE); PUBO-VAGINAL SLING - sparc sling; HYSTERECTOMY ABDOMINAL - abdominal hysterectomy with left salpingo-ophoorectomy  Patient Location: PACU  Anesthesia Type: General  Level of Consciousness: awake, alert  and oriented  Airway and Oxygen Therapy: Patient Spontanous Breathing  Post-op Pain: mild  Post-op Assessment: Post-op Vital signs reviewed, Patient's Cardiovascular Status Stable, Respiratory Function Stable, Patent Airway, No signs of Nausea or vomiting and Pain level controlled  Post-op Vital Signs: Reviewed and stable  Complications: No apparent anesthesia complications

## 2011-11-14 LAB — CBC
MCV: 92.6 fL (ref 78.0–100.0)
Platelets: 207 10*3/uL (ref 150–400)
RBC: 3.23 MIL/uL — ABNORMAL LOW (ref 3.87–5.11)
RDW: 12.6 % (ref 11.5–15.5)
WBC: 11.3 10*3/uL — ABNORMAL HIGH (ref 4.0–10.5)

## 2011-11-14 MED ORDER — HYDROCODONE-ACETAMINOPHEN 5-500 MG PO TABS: 1.0000 | ORAL_TABLET | Freq: Four times a day (QID) | ORAL | Status: AC | PRN

## 2011-11-14 MED ORDER — OXYCODONE-ACETAMINOPHEN 5-325 MG PO TABS
1.0000 | ORAL_TABLET | ORAL | Status: DC | PRN
  Administered 2011-11-14 – 2011-11-15 (×4): 1 via ORAL
  Filled 2011-11-14 (×4): qty 1

## 2011-11-14 MED ORDER — CIPROFLOXACIN HCL 250 MG PO TABS: 250.0000 mg | ORAL_TABLET | Freq: Two times a day (BID) | ORAL | Status: AC

## 2011-11-14 NOTE — Progress Notes (Signed)
Vitals normal Laboratory tests normal Patient alert and stable Pain minimal and well-controlled Post treatment course discussed in detail Followup discussed in detail See orders 

## 2011-11-14 NOTE — Progress Notes (Signed)
1 Day Post-Op Procedure(s): LAPAROSCOPIC ASSISTED VAGINAL HYSTERECTOMY ANTERIOR (CYSTOCELE) AND POSTERIOR REPAIR (RECTOCELE) PUBO-VAGINAL SLING HYSTERECTOMY ABDOMINAL  Subjective: Patient reports tolerating PO.    Objective: I have reviewed patient's vital signs and labs.  Inc C/D, abd soft + BS  Assessment: s/p Procedure(s): LAPAROSCOPIC ASSISTED VAGINAL HYSTERECTOMY ANTERIOR (CYSTOCELE) AND POSTERIOR REPAIR (RECTOCELE) PUBO-VAGINAL SLING HYSTERECTOMY ABDOMINAL: stable  Plan: Advance diet Encourage ambulation Advance to PO medication Discontinue IV fluids  LOS: 1 day    Taylor Holloway M 11/14/2011, 8:24 AM

## 2011-11-14 NOTE — Progress Notes (Signed)
UR Chart review completed.  

## 2011-11-15 MED ORDER — IBUPROFEN 800 MG PO TABS: 800.0000 mg | ORAL_TABLET | Freq: Three times a day (TID) | ORAL | Status: AC | PRN

## 2011-11-15 NOTE — Progress Notes (Signed)
D/c instructions reviewed with pt.  Pt states understanding of same.  No home equipment needed.  Ambulated to car with staff without incident.  D/c'd home with family.

## 2011-11-15 NOTE — Discharge Summary (Signed)
Physician Discharge Summary  Patient ID: Taylor Holloway MRN: [id number] DOB/AGE: Nov 22, 1960 51 y.o.  Admit date: 11/13/2011 Discharge date: 11/15/2011  Admission Diagnoses:  Discharge Diagnoses:  Active Problems:  * No active hospital problems. *    Discharged Condition: good  Hospital Course: TAH + LSO and pubovag sling + post repair, Cath out POD #1, diet advanced, PVR <100 cc POD 1 and 2, ON Q cath out X 2 on POD 2, abd benign>>ready for D/C  Consults: none  Significant Diagnostic Studies: labs:  Results for orders placed during the hospital encounter of 11/13/11 (from the past 48 hour(s))  CBC     Status: Abnormal   Collection Time   11/14/11  5:20 AM      Component Value Range Comment   WBC 11.3 (*) 4.0 - 10.5 (K/uL)    RBC 3.23 (*) 3.87 - 5.11 (MIL/uL)    Hemoglobin 10.1 (*) 12.0 - 15.0 (g/dL)    HCT 78.2 (*) 95.6 - 46.0 (%)    MCV 92.6  78.0 - 100.0 (fL)    MCH 31.3  26.0 - 34.0 (pg)    MCHC 33.8  30.0 - 36.0 (g/dL)    RDW 21.3  08.6 - 57.8 (%)    Platelets 207  150 - 400 (K/uL)     Treatments: surgery: TAH LSO, sling + post rep  Discharge Exam: Blood pressure 100/67, pulse 97, temperature 98.4 F (36.9 C), temperature source Oral, resp. rate 18, height 5\' 1"  (1.549 m), weight 126 lb (57.153 kg), SpO2 95.00%. Incision/Wound:C/D  Disposition: Final discharge disposition not confirmed   Medication List  As of 11/15/2011 10:19 AM   STOP taking these medications         acetaminophen 500 MG tablet         TAKE these medications         ciprofloxacin 250 MG tablet   Commonly known as: CIPRO   Take 1 tablet (250 mg total) by mouth 2 (two) times daily.      doxycycline 50 MG tablet   Commonly known as: ADOXA   Take 50 mg by mouth daily. Taking for eye infection prophylaxis for 2 months. Started in November      estradiol 0.1 MG/24HR   Commonly known as: VIVELLE-DOT   Place 1 patch onto the skin 2 (two) times a week. Changes patch on Tuesdays and  Saturdays      HYDROcodone-acetaminophen 5-500 MG per tablet   Commonly known as: VICODIN   Take 1-2 tablets by mouth every 6 (six) hours as needed for pain.      ibuprofen 800 MG tablet   Commonly known as: ADVIL,MOTRIN   Take 1 tablet (800 mg total) by mouth every 8 (eight) hours as needed for pain (mild pain).      mulitivitamin with minerals Tabs   Take 1 tablet by mouth daily.           Follow-up Information    Follow up with Meriel Pica, MD in 10 days. (my office will call)    Contact information:   [address] [place] [address] [phone]          Signed: Meriel Pica 11/15/2011, 10:19 AM

## 2011-11-15 NOTE — Progress Notes (Signed)
2 Days Post-Op Procedure(s): LAPAROSCOPIC ASSISTED VAGINAL HYSTERECTOMY ANTERIOR (CYSTOCELE) AND POSTERIOR REPAIR (RECTOCELE) PUBO-VAGINAL SLING HYSTERECTOMY ABDOMINAL  Subjective: Patient reports tolerating PO.    Objective: I have reviewed patient's vital signs, intake and output, medications and labs.  ON Q cath out intact X 2, Inc C/D, abd soft, flat +BS  Assessment: s/p Procedure(s): LAPAROSCOPIC ASSISTED VAGINAL HYSTERECTOMY ANTERIOR (CYSTOCELE) AND POSTERIOR REPAIR (RECTOCELE) PUBO-VAGINAL SLING HYSTERECTOMY ABDOMINAL: stable  Plan: Discharge home  LOS: 2 days    Aland Chestnutt M 11/15/2011, 10:16 AM

## 2011-11-17 ENCOUNTER — Encounter (HOSPITAL_COMMUNITY): Payer: Self-pay | Admitting: Obstetrics and Gynecology

## 2011-12-10 ENCOUNTER — Other Ambulatory Visit: Payer: Self-pay | Admitting: Dermatology

## 2011-12-17 ENCOUNTER — Ambulatory Visit (HOSPITAL_COMMUNITY)
Admission: RE | Admit: 2011-12-17 | Discharge: 2011-12-17 | Disposition: A | Discharge status: Home / Self Care | Payer: 59 | Source: Ambulatory Visit | Attending: Obstetrics and Gynecology | Admitting: Obstetrics and Gynecology

## 2011-12-17 DIAGNOSIS — Z1231 Encounter for screening mammogram for malignant neoplasm of breast: Secondary | ICD-10-CM | POA: Insufficient documentation

## 2012-01-14 ENCOUNTER — Other Ambulatory Visit: Payer: Self-pay | Admitting: Gastroenterology

## 2012-11-29 ENCOUNTER — Other Ambulatory Visit (HOSPITAL_COMMUNITY): Payer: Self-pay | Admitting: Obstetrics and Gynecology

## 2012-11-29 DIAGNOSIS — Z1231 Encounter for screening mammogram for malignant neoplasm of breast: Secondary | ICD-10-CM

## 2013-01-04 ENCOUNTER — Ambulatory Visit (HOSPITAL_COMMUNITY): Payer: 59

## 2013-01-18 ENCOUNTER — Ambulatory Visit (HOSPITAL_COMMUNITY)
Admission: RE | Admit: 2013-01-18 | Discharge: 2013-01-18 | Disposition: A | Discharge status: Home / Self Care | Payer: 59 | Source: Ambulatory Visit | Attending: Obstetrics and Gynecology | Admitting: Obstetrics and Gynecology

## 2013-01-18 DIAGNOSIS — Z1231 Encounter for screening mammogram for malignant neoplasm of breast: Secondary | ICD-10-CM | POA: Insufficient documentation

## 2013-08-11 ENCOUNTER — Other Ambulatory Visit: Payer: Self-pay | Admitting: Dermatology

## 2013-11-09 ENCOUNTER — Ambulatory Visit (INDEPENDENT_AMBULATORY_CARE_PROVIDER_SITE_OTHER)
Admission: RE | Admit: 2013-11-09 | Discharge: 2013-11-09 | Disposition: A | Discharge status: Home / Self Care | Payer: Federal, State, Local not specified - PPO | Source: Ambulatory Visit | Attending: Internal Medicine | Admitting: Internal Medicine

## 2013-11-09 ENCOUNTER — Telehealth: Payer: Self-pay | Admitting: Internal Medicine

## 2013-11-09 ENCOUNTER — Ambulatory Visit (INDEPENDENT_AMBULATORY_CARE_PROVIDER_SITE_OTHER): Payer: Federal, State, Local not specified - PPO | Admitting: Internal Medicine

## 2013-11-09 ENCOUNTER — Encounter: Payer: Self-pay | Admitting: Internal Medicine

## 2013-11-09 VITALS — BP 122/80 | HR 63 | Temp 97.6°F | Resp 16 | Ht 60.0 in | Wt 128.0 lb

## 2013-11-09 DIAGNOSIS — R059 Cough, unspecified: Secondary | ICD-10-CM | POA: Insufficient documentation

## 2013-11-09 DIAGNOSIS — J189 Pneumonia, unspecified organism: Secondary | ICD-10-CM | POA: Insufficient documentation

## 2013-11-09 DIAGNOSIS — R05 Cough: Secondary | ICD-10-CM

## 2013-11-09 MED ORDER — AZITHROMYCIN 500 MG PO TABS: 500.0000 mg | ORAL_TABLET | Freq: Every day | ORAL | Status: AC

## 2013-11-09 MED ORDER — HYDROCOD POLST-CHLORPHEN POLST 10-8 MG/5ML PO LQCR: 5.0000 mL | Freq: Two times a day (BID) | ORAL | Status: AC | PRN

## 2013-11-09 NOTE — Telephone Encounter (Signed)
ok 

## 2013-11-09 NOTE — Telephone Encounter (Signed)
Pt scheduled for today.  

## 2013-11-09 NOTE — Telephone Encounter (Signed)
Pt called request to reestablish with Dr. Yetta BarreJones. Pt last ov was 2010. Pt request to be work in for sinus and bronchitis today if it is possible? Please advise. [employer] is current insurance.

## 2013-11-09 NOTE — Patient Instructions (Signed)

## 2013-11-09 NOTE — Progress Notes (Signed)
Pre visit review using our clinic review tool, if applicable. No additional management support is needed unless otherwise documented below in the visit note. 

## 2013-11-11 NOTE — Progress Notes (Signed)
   Subjective:    Patient ID: Taylor Holloway, female    DOB: 05-17-1961, 53 y.o.   MRN: [id number]  Cough This is a new problem. Episode onset: for one week. The cough is productive of purulent sputum. Associated symptoms include chills, a fever, headaches, shortness of breath and sweats. Pertinent negatives include no chest pain, ear congestion, ear pain, heartburn, hemoptysis, myalgias, nasal congestion, postnasal drip, rash, rhinorrhea, sore throat, weight loss or wheezing. She has tried OTC cough suppressant for the symptoms. The treatment provided mild relief. Her past medical history is significant for bronchitis and pneumonia. There is no history of asthma, bronchiectasis, COPD, emphysema or environmental allergies.      Review of Systems  Constitutional: Positive for fever and chills. Negative for weight loss, diaphoresis, activity change, appetite change, fatigue and unexpected weight change.  HENT: Negative for ear pain, postnasal drip, rhinorrhea, sore throat, tinnitus, trouble swallowing and voice change.   Eyes: Negative.   Respiratory: Positive for cough and shortness of breath. Negative for apnea, hemoptysis, choking, chest tightness, wheezing and stridor.   Cardiovascular: Negative.  Negative for chest pain, palpitations and leg swelling.  Gastrointestinal: Negative.  Negative for heartburn, nausea, vomiting, abdominal pain, diarrhea, constipation and blood in stool.  Endocrine: Negative.   Genitourinary: Negative.   Musculoskeletal: Negative.  Negative for myalgias.  Skin: Negative.  Negative for rash.  Allergic/Immunologic: Negative.  Negative for environmental allergies.  Neurological: Positive for headaches.  Hematological: Negative.  Negative for adenopathy. Does not bruise/bleed easily.  Psychiatric/Behavioral: Negative.        Objective:   Physical Exam  Vitals reviewed. Constitutional: She is oriented to person, place, and time. She appears well-developed and  well-nourished.  Non-toxic appearance. She does not have a sickly appearance. She does not appear ill. No distress.  HENT:  Head: Normocephalic and atraumatic.  Mouth/Throat: Oropharynx is clear and moist. No oropharyngeal exudate.  Eyes: Conjunctivae are normal. Right eye exhibits no discharge. Left eye exhibits no discharge. No scleral icterus.  Neck: Normal range of motion. Neck supple. No JVD present. No tracheal deviation present. No thyromegaly present.  Cardiovascular: Normal rate, regular rhythm, normal heart sounds and intact distal pulses.  Exam reveals no gallop and no friction rub.   No murmur heard. Pulmonary/Chest: Effort normal and breath sounds normal. No stridor. No respiratory distress. She has no wheezes. She has no rales. She exhibits no tenderness.  Abdominal: Soft. Bowel sounds are normal. She exhibits no distension and no mass. There is no tenderness. There is no rebound and no guarding.  Musculoskeletal: Normal range of motion. She exhibits no edema and no tenderness.  Lymphadenopathy:    She has no cervical adenopathy.  Neurological: She is oriented to person, place, and time.  Skin: Skin is warm and dry. No rash noted. She is not diaphoretic. No erythema. No pallor.  Psychiatric: She has a normal mood and affect. Her behavior is normal. Judgment and thought content normal.     Lab Results  Component Value Date   WBC 11.3* 11/14/2011   HGB 10.1* 11/14/2011   HCT 29.9* 11/14/2011   PLT 207 11/14/2011   INR 0.94 11/12/2011       Assessment & Plan:

## 2013-11-11 NOTE — Assessment & Plan Note (Signed)
Clinically, I am concerned that she may have pneumonia so I have asked her to start zpak and a cough suppressant

## 2013-11-11 NOTE — Assessment & Plan Note (Signed)
Her CXR is normal.

## 2014-03-10 ENCOUNTER — Other Ambulatory Visit (HOSPITAL_COMMUNITY): Payer: Self-pay | Admitting: Obstetrics and Gynecology

## 2014-03-10 DIAGNOSIS — Z1231 Encounter for screening mammogram for malignant neoplasm of breast: Secondary | ICD-10-CM

## 2014-03-22 ENCOUNTER — Ambulatory Visit (HOSPITAL_COMMUNITY)
Admission: RE | Admit: 2014-03-22 | Discharge: 2014-03-22 | Disposition: A | Discharge status: Home / Self Care | Payer: Federal, State, Local not specified - PPO | Source: Ambulatory Visit | Attending: Obstetrics and Gynecology | Admitting: Obstetrics and Gynecology

## 2014-03-22 DIAGNOSIS — Z1231 Encounter for screening mammogram for malignant neoplasm of breast: Secondary | ICD-10-CM | POA: Insufficient documentation

## 2015-03-16 ENCOUNTER — Other Ambulatory Visit: Payer: Self-pay | Admitting: Obstetrics and Gynecology

## 2015-03-19 LAB — CYTOLOGY - PAP

## 2015-04-25 ENCOUNTER — Other Ambulatory Visit (HOSPITAL_COMMUNITY): Payer: Self-pay | Admitting: Obstetrics and Gynecology

## 2015-04-25 DIAGNOSIS — Z1231 Encounter for screening mammogram for malignant neoplasm of breast: Secondary | ICD-10-CM

## 2015-04-30 ENCOUNTER — Ambulatory Visit (HOSPITAL_COMMUNITY)
Admission: RE | Admit: 2015-04-30 | Discharge: 2015-04-30 | Disposition: A | Discharge status: Home / Self Care | Payer: Federal, State, Local not specified - PPO | Source: Ambulatory Visit | Attending: Obstetrics and Gynecology | Admitting: Obstetrics and Gynecology

## 2015-04-30 DIAGNOSIS — Z1231 Encounter for screening mammogram for malignant neoplasm of breast: Secondary | ICD-10-CM | POA: Diagnosis not present

## 2016-01-31 MED FILL — ESTRADIOL 0.1 MG PATCH: 0.1 | 84 days supply | Qty: 24 | Fill #3

## 2016-03-18 MED FILL — FLUOCINONIDE 0.05% SOLUTION: 0.05 | 30 days supply | Qty: 60 | Fill #0

## 2016-04-29 MED FILL — ESTRADIOL 0.1 MG PATCH: 0.1 | 84 days supply | Qty: 24 | Fill #0

## 2016-05-01 ENCOUNTER — Other Ambulatory Visit: Payer: Self-pay | Admitting: Obstetrics and Gynecology

## 2016-05-01 DIAGNOSIS — Z1231 Encounter for screening mammogram for malignant neoplasm of breast: Secondary | ICD-10-CM

## 2016-05-07 ENCOUNTER — Ambulatory Visit
Admission: RE | Admit: 2016-05-07 | Discharge: 2016-05-07 | Disposition: A | Discharge status: Home / Self Care | Payer: Federal, State, Local not specified - PPO | Source: Ambulatory Visit | Attending: Obstetrics and Gynecology | Admitting: Obstetrics and Gynecology

## 2016-05-07 DIAGNOSIS — Z1231 Encounter for screening mammogram for malignant neoplasm of breast: Secondary | ICD-10-CM

## 2016-05-12 ENCOUNTER — Other Ambulatory Visit: Payer: Self-pay | Admitting: Obstetrics and Gynecology

## 2016-05-12 DIAGNOSIS — R928 Other abnormal and inconclusive findings on diagnostic imaging of breast: Secondary | ICD-10-CM

## 2016-05-13 ENCOUNTER — Other Ambulatory Visit: Payer: Self-pay | Admitting: Family Medicine

## 2016-05-16 ENCOUNTER — Ambulatory Visit
Admission: RE | Admit: 2016-05-16 | Discharge: 2016-05-16 | Disposition: A | Discharge status: Home / Self Care | Payer: Federal, State, Local not specified - PPO | Source: Ambulatory Visit | Attending: Obstetrics and Gynecology | Admitting: Obstetrics and Gynecology

## 2016-05-16 DIAGNOSIS — R928 Other abnormal and inconclusive findings on diagnostic imaging of breast: Secondary | ICD-10-CM

## 2016-08-04 DIAGNOSIS — M7502 Adhesive capsulitis of left shoulder: Secondary | ICD-10-CM | POA: Diagnosis not present

## 2016-08-07 DIAGNOSIS — M7502 Adhesive capsulitis of left shoulder: Secondary | ICD-10-CM | POA: Diagnosis not present

## 2016-08-11 DIAGNOSIS — M7502 Adhesive capsulitis of left shoulder: Secondary | ICD-10-CM | POA: Diagnosis not present

## 2016-08-18 DIAGNOSIS — M7502 Adhesive capsulitis of left shoulder: Secondary | ICD-10-CM | POA: Diagnosis not present

## 2016-08-20 DIAGNOSIS — M7502 Adhesive capsulitis of left shoulder: Secondary | ICD-10-CM | POA: Diagnosis not present

## 2016-10-13 ENCOUNTER — Other Ambulatory Visit: Payer: Self-pay | Admitting: Obstetrics and Gynecology

## 2016-10-13 DIAGNOSIS — N6310 Unspecified lump in the right breast, unspecified quadrant: Secondary | ICD-10-CM

## 2016-11-07 MED FILL — ESTRADIOL 0.1 MG PATCH: 0.1 | 84 days supply | Qty: 24 | Fill #1

## 2016-11-20 ENCOUNTER — Other Ambulatory Visit: Payer: Federal, State, Local not specified - PPO

## 2016-11-26 ENCOUNTER — Ambulatory Visit
Admission: RE | Admit: 2016-11-26 | Discharge: 2016-11-26 | Disposition: A | Discharge status: Home / Self Care | Payer: Federal, State, Local not specified - PPO | Source: Ambulatory Visit | Attending: Obstetrics and Gynecology | Admitting: Obstetrics and Gynecology

## 2016-11-26 DIAGNOSIS — R922 Inconclusive mammogram: Secondary | ICD-10-CM | POA: Diagnosis not present

## 2016-11-26 DIAGNOSIS — N6310 Unspecified lump in the right breast, unspecified quadrant: Secondary | ICD-10-CM

## 2016-11-26 DIAGNOSIS — N6001 Solitary cyst of right breast: Secondary | ICD-10-CM | POA: Diagnosis not present

## 2017-02-27 DIAGNOSIS — H00021 Hordeolum internum right upper eyelid: Secondary | ICD-10-CM | POA: Diagnosis not present

## 2017-02-27 MED FILL — DOXYCYCLINE HYC 100 MG TAB: 100 | 10 days supply | Qty: 20 | Fill #0

## 2017-04-13 DIAGNOSIS — Z6825 Body mass index (BMI) 25.0-25.9, adult: Secondary | ICD-10-CM | POA: Diagnosis not present

## 2017-04-13 DIAGNOSIS — Z01419 Encounter for gynecological examination (general) (routine) without abnormal findings: Secondary | ICD-10-CM | POA: Diagnosis not present

## 2017-04-13 MED FILL — ESTRADIOL 0.075 MG PATCH: 0.075 | 28 days supply | Qty: 8 | Fill #0

## 2017-04-16 MED FILL — FLUOCINONIDE 0.05% SOLUTION: 0.05 | 30 days supply | Qty: 60 | Fill #0

## 2017-04-22 DIAGNOSIS — Z13228 Encounter for screening for other metabolic disorders: Secondary | ICD-10-CM | POA: Diagnosis not present

## 2017-04-22 DIAGNOSIS — Z13220 Encounter for screening for lipoid disorders: Secondary | ICD-10-CM | POA: Diagnosis not present

## 2017-05-07 ENCOUNTER — Other Ambulatory Visit: Payer: Self-pay | Admitting: Obstetrics and Gynecology

## 2017-05-07 DIAGNOSIS — Z1231 Encounter for screening mammogram for malignant neoplasm of breast: Secondary | ICD-10-CM

## 2017-05-18 MED FILL — GAVILYTE-N SOLUTION: 420 | 1 days supply | Qty: 4000 | Fill #0

## 2017-05-19 ENCOUNTER — Ambulatory Visit
Admission: RE | Admit: 2017-05-19 | Discharge: 2017-05-19 | Disposition: A | Discharge status: Home / Self Care | Payer: Federal, State, Local not specified - PPO | Source: Ambulatory Visit | Attending: Obstetrics and Gynecology | Admitting: Obstetrics and Gynecology

## 2017-05-19 DIAGNOSIS — Z1231 Encounter for screening mammogram for malignant neoplasm of breast: Secondary | ICD-10-CM

## 2017-05-26 DIAGNOSIS — D123 Benign neoplasm of transverse colon: Secondary | ICD-10-CM | POA: Diagnosis not present

## 2017-05-26 DIAGNOSIS — Z86010 Personal history of colonic polyps: Secondary | ICD-10-CM | POA: Diagnosis not present

## 2017-05-28 DIAGNOSIS — D123 Benign neoplasm of transverse colon: Secondary | ICD-10-CM | POA: Diagnosis not present

## 2017-05-28 MED FILL — ESTRADIOL 0.075 MG PATCH: 0.075 | 28 days supply | Qty: 8 | Fill #1

## 2017-06-09 DIAGNOSIS — L821 Other seborrheic keratosis: Secondary | ICD-10-CM | POA: Diagnosis not present

## 2017-06-09 DIAGNOSIS — D225 Melanocytic nevi of trunk: Secondary | ICD-10-CM | POA: Diagnosis not present

## 2017-06-09 DIAGNOSIS — L650 Telogen effluvium: Secondary | ICD-10-CM | POA: Diagnosis not present

## 2017-06-09 DIAGNOSIS — L739 Follicular disorder, unspecified: Secondary | ICD-10-CM | POA: Diagnosis not present

## 2017-07-09 MED FILL — ESTRADIOL 0.075 MG PATCH: 0.075 | 28 days supply | Qty: 8 | Fill #2

## 2017-08-18 MED FILL — ESTRADIOL 0.075 MG PATCH: 0.075 | 28 days supply | Qty: 8 | Fill #3

## 2017-09-23 MED FILL — ESTRADIOL 0.075 MG PATCH: 0.075 | 28 days supply | Qty: 8 | Fill #4

## 2017-10-29 DIAGNOSIS — M79671 Pain in right foot: Secondary | ICD-10-CM | POA: Diagnosis not present

## 2017-10-29 MED FILL — ESTRADIOL 0.075 MG PATCH: 0.075 | 28 days supply | Qty: 8 | Fill #5

## 2017-12-05 DIAGNOSIS — M79671 Pain in right foot: Secondary | ICD-10-CM | POA: Diagnosis not present

## 2017-12-14 MED FILL — ESTRADIOL 0.075 MG PATCH: 0.075 | 28 days supply | Qty: 8 | Fill #6

## 2018-01-22 DIAGNOSIS — M79671 Pain in right foot: Secondary | ICD-10-CM | POA: Diagnosis not present

## 2018-01-26 MED FILL — ESTRADIOL 0.075 MG PATCH: 0.075 | 28 days supply | Qty: 8 | Fill #7

## 2018-03-10 MED FILL — ESTRADIOL 0.075 MG PATCH: 0.075 | 28 days supply | Qty: 8 | Fill #8

## 2018-05-04 MED FILL — ESTRADIOL 0.075 MG PATCH: 0.075 | 28 days supply | Qty: 8 | Fill #0

## 2018-05-10 DIAGNOSIS — Z01419 Encounter for gynecological examination (general) (routine) without abnormal findings: Secondary | ICD-10-CM | POA: Diagnosis not present

## 2018-05-10 DIAGNOSIS — Z6826 Body mass index (BMI) 26.0-26.9, adult: Secondary | ICD-10-CM | POA: Diagnosis not present

## 2018-06-17 MED FILL — ESTRADIOL 0.075 MG PATCH: 0.075 | 28 days supply | Qty: 8 | Fill #0

## 2018-07-01 DIAGNOSIS — Z23 Encounter for immunization: Secondary | ICD-10-CM | POA: Diagnosis not present

## 2018-07-01 DIAGNOSIS — Z136 Encounter for screening for cardiovascular disorders: Secondary | ICD-10-CM | POA: Diagnosis not present

## 2018-07-01 DIAGNOSIS — Z0000 Encounter for general adult medical examination without abnormal findings: Secondary | ICD-10-CM | POA: Diagnosis not present

## 2018-07-01 DIAGNOSIS — E559 Vitamin D deficiency, unspecified: Secondary | ICD-10-CM | POA: Diagnosis not present

## 2018-07-07 DIAGNOSIS — L650 Telogen effluvium: Secondary | ICD-10-CM | POA: Diagnosis not present

## 2018-07-07 DIAGNOSIS — D2371 Other benign neoplasm of skin of right lower limb, including hip: Secondary | ICD-10-CM | POA: Diagnosis not present

## 2018-07-07 DIAGNOSIS — L821 Other seborrheic keratosis: Secondary | ICD-10-CM | POA: Diagnosis not present

## 2018-07-07 DIAGNOSIS — L658 Other specified nonscarring hair loss: Secondary | ICD-10-CM | POA: Diagnosis not present

## 2018-07-30 MED FILL — ESTRADIOL 0.075 MG PATCH: 0.075 | 28 days supply | Qty: 8 | Fill #1

## 2018-08-16 ENCOUNTER — Other Ambulatory Visit: Payer: Self-pay | Admitting: Obstetrics and Gynecology

## 2018-08-16 DIAGNOSIS — Z1231 Encounter for screening mammogram for malignant neoplasm of breast: Secondary | ICD-10-CM

## 2018-08-24 ENCOUNTER — Ambulatory Visit
Admission: RE | Admit: 2018-08-24 | Discharge: 2018-08-24 | Disposition: A | Discharge status: Home / Self Care | Payer: Federal, State, Local not specified - PPO | Source: Ambulatory Visit | Attending: Obstetrics and Gynecology | Admitting: Obstetrics and Gynecology

## 2018-08-24 DIAGNOSIS — Z1231 Encounter for screening mammogram for malignant neoplasm of breast: Secondary | ICD-10-CM | POA: Diagnosis not present

## 2018-08-25 ENCOUNTER — Other Ambulatory Visit: Payer: Self-pay | Admitting: Obstetrics and Gynecology

## 2018-08-25 DIAGNOSIS — R928 Other abnormal and inconclusive findings on diagnostic imaging of breast: Secondary | ICD-10-CM

## 2018-09-01 ENCOUNTER — Ambulatory Visit
Admission: RE | Admit: 2018-09-01 | Discharge: 2018-09-01 | Disposition: A | Discharge status: Home / Self Care | Payer: Federal, State, Local not specified - PPO | Source: Ambulatory Visit | Attending: Obstetrics and Gynecology | Admitting: Obstetrics and Gynecology

## 2018-09-01 DIAGNOSIS — R928 Other abnormal and inconclusive findings on diagnostic imaging of breast: Secondary | ICD-10-CM

## 2018-09-01 DIAGNOSIS — N6012 Diffuse cystic mastopathy of left breast: Secondary | ICD-10-CM | POA: Diagnosis not present

## 2018-09-17 MED FILL — DOTTI 0.075 MG/24HR PTTW: 0.075 | 28 days supply | Qty: 8 | Fill #2

## 2018-09-27 DIAGNOSIS — Z23 Encounter for immunization: Secondary | ICD-10-CM | POA: Diagnosis not present

## 2018-10-22 MED FILL — DOTTI 0.075 MG/24HR PTTW: 0.075 | 28 days supply | Qty: 8 | Fill #3

## 2018-12-09 MED FILL — DOTTI 0.075 MG/24HR PTTW: 0.075 | 28 days supply | Qty: 8 | Fill #4

## 2019-01-18 MED FILL — DOTTI 0.075 MG/24HR PTTW: 0.075 | 28 days supply | Qty: 8 | Fill #5

## 2019-02-15 MED FILL — DOTTI 0.075 MG/24HR PTTW: 0.075 | 28 days supply | Qty: 8 | Fill #6

## 2019-04-15 MED FILL — DOTTI 0.075 MG/24HR PTTW: 0.075 | 28 days supply | Qty: 8 | Fill #7

## 2019-05-19 DIAGNOSIS — Z6826 Body mass index (BMI) 26.0-26.9, adult: Secondary | ICD-10-CM | POA: Diagnosis not present

## 2019-05-19 DIAGNOSIS — Z01419 Encounter for gynecological examination (general) (routine) without abnormal findings: Secondary | ICD-10-CM | POA: Diagnosis not present

## 2019-05-19 MED FILL — DOTTI 0.075 MG/24HR PTTW: 0.075 | 24 days supply | Qty: 24 | Fill #0

## 2019-06-15 DIAGNOSIS — Z13820 Encounter for screening for osteoporosis: Secondary | ICD-10-CM | POA: Diagnosis not present

## 2019-08-16 ENCOUNTER — Other Ambulatory Visit: Payer: Self-pay | Admitting: Obstetrics and Gynecology

## 2019-08-16 DIAGNOSIS — Z1231 Encounter for screening mammogram for malignant neoplasm of breast: Secondary | ICD-10-CM

## 2019-09-12 MED FILL — ESTRADIOL 0.075 MG PATCH: 0.075 | 24 days supply | Qty: 24 | Fill #1

## 2019-10-03 ENCOUNTER — Other Ambulatory Visit: Payer: Self-pay

## 2019-10-03 ENCOUNTER — Ambulatory Visit
Admission: RE | Admit: 2019-10-03 | Discharge: 2019-10-03 | Disposition: A | Discharge status: Home / Self Care | Payer: Federal, State, Local not specified - PPO | Source: Ambulatory Visit | Attending: Obstetrics and Gynecology | Admitting: Obstetrics and Gynecology

## 2019-10-03 DIAGNOSIS — Z1231 Encounter for screening mammogram for malignant neoplasm of breast: Secondary | ICD-10-CM | POA: Diagnosis not present

## 2019-12-23 MED FILL — DOTTI 0.075 MG/24HR PTTW: 0.075 | 84 days supply | Qty: 24 | Fill #2

## 2020-03-09 DIAGNOSIS — Z0000 Encounter for general adult medical examination without abnormal findings: Secondary | ICD-10-CM | POA: Diagnosis not present

## 2020-03-09 DIAGNOSIS — Z13220 Encounter for screening for lipoid disorders: Secondary | ICD-10-CM | POA: Diagnosis not present

## 2020-03-21 MED FILL — DOTTI 0.075 MG/24HR PTTW: 0.075 | 84 days supply | Qty: 24 | Fill #3

## 2020-05-23 ENCOUNTER — Other Ambulatory Visit (HOSPITAL_COMMUNITY): Payer: Self-pay | Admitting: Obstetrics and Gynecology

## 2020-05-23 DIAGNOSIS — Z01419 Encounter for gynecological examination (general) (routine) without abnormal findings: Secondary | ICD-10-CM | POA: Diagnosis not present

## 2020-05-23 DIAGNOSIS — Z79890 Hormone replacement therapy (postmenopausal): Secondary | ICD-10-CM | POA: Diagnosis not present

## 2020-06-25 MED FILL — DOTTI 0.075 MG/24HR PTTW: 0.075 | 84 days supply | Qty: 24 | Fill #0

## 2020-09-05 DIAGNOSIS — B079 Viral wart, unspecified: Secondary | ICD-10-CM | POA: Diagnosis not present

## 2020-09-05 DIAGNOSIS — D485 Neoplasm of uncertain behavior of skin: Secondary | ICD-10-CM | POA: Diagnosis not present

## 2020-09-05 DIAGNOSIS — L729 Follicular cyst of the skin and subcutaneous tissue, unspecified: Secondary | ICD-10-CM | POA: Diagnosis not present

## 2020-09-05 DIAGNOSIS — D225 Melanocytic nevi of trunk: Secondary | ICD-10-CM | POA: Diagnosis not present

## 2020-09-17 MED FILL — DOTTI 0.075 MG/24HR PTTW: 0.075 | 84 days supply | Qty: 24 | Fill #1

## 2021-01-07 MED FILL — DOTTI 0.075 MG/24HR PTTW: 0.075 | 84 days supply | Qty: 24 | Fill #2

## 2021-01-08 ENCOUNTER — Other Ambulatory Visit (HOSPITAL_COMMUNITY): Payer: Self-pay | Admitting: Dermatology

## 2021-01-08 DIAGNOSIS — L821 Other seborrheic keratosis: Secondary | ICD-10-CM | POA: Diagnosis not present

## 2021-01-08 DIAGNOSIS — D225 Melanocytic nevi of trunk: Secondary | ICD-10-CM | POA: Diagnosis not present

## 2021-01-08 DIAGNOSIS — D2371 Other benign neoplasm of skin of right lower limb, including hip: Secondary | ICD-10-CM | POA: Diagnosis not present

## 2021-01-08 DIAGNOSIS — L300 Nummular dermatitis: Secondary | ICD-10-CM | POA: Diagnosis not present

## 2021-01-08 MED FILL — TRIAMCINOLONE 0.1% CREAM: 0.1 | 7 days supply | Qty: 80 | Fill #0

## 2021-02-06 ENCOUNTER — Other Ambulatory Visit: Payer: Self-pay | Admitting: Obstetrics and Gynecology

## 2021-02-06 DIAGNOSIS — Z1231 Encounter for screening mammogram for malignant neoplasm of breast: Secondary | ICD-10-CM

## 2021-02-13 ENCOUNTER — Ambulatory Visit
Admission: RE | Admit: 2021-02-13 | Discharge: 2021-02-13 | Disposition: A | Discharge status: Home / Self Care | Payer: Federal, State, Local not specified - PPO | Source: Ambulatory Visit | Attending: Obstetrics and Gynecology | Admitting: Obstetrics and Gynecology

## 2021-02-13 ENCOUNTER — Other Ambulatory Visit: Payer: Self-pay

## 2021-02-13 DIAGNOSIS — Z1231 Encounter for screening mammogram for malignant neoplasm of breast: Secondary | ICD-10-CM

## 2021-02-14 ENCOUNTER — Other Ambulatory Visit: Payer: Self-pay | Admitting: Obstetrics and Gynecology

## 2021-02-14 DIAGNOSIS — R928 Other abnormal and inconclusive findings on diagnostic imaging of breast: Secondary | ICD-10-CM

## 2021-03-07 ENCOUNTER — Other Ambulatory Visit: Payer: Self-pay

## 2021-03-07 ENCOUNTER — Ambulatory Visit
Admission: RE | Admit: 2021-03-07 | Discharge: 2021-03-07 | Disposition: A | Discharge status: Home / Self Care | Payer: Federal, State, Local not specified - PPO | Source: Ambulatory Visit | Attending: Obstetrics and Gynecology | Admitting: Obstetrics and Gynecology

## 2021-03-07 DIAGNOSIS — R928 Other abnormal and inconclusive findings on diagnostic imaging of breast: Secondary | ICD-10-CM

## 2021-03-07 DIAGNOSIS — N6321 Unspecified lump in the left breast, upper outer quadrant: Secondary | ICD-10-CM | POA: Diagnosis not present

## 2021-04-02 ENCOUNTER — Inpatient Hospital Stay: Admission: RE | Admit: 2021-04-02 | Payer: Federal, State, Local not specified - PPO | Source: Ambulatory Visit

## 2021-04-24 ENCOUNTER — Other Ambulatory Visit (HOSPITAL_COMMUNITY): Payer: Self-pay

## 2021-04-24 MED FILL — Estradiol TD Patch Twice Weekly 0.075 MG/24HR: TRANSDERMAL | 84 days supply | Qty: 24 | Fill #0 | Status: AC

## 2021-06-20 ENCOUNTER — Other Ambulatory Visit (HOSPITAL_COMMUNITY): Payer: Self-pay

## 2021-06-20 DIAGNOSIS — Z6827 Body mass index (BMI) 27.0-27.9, adult: Secondary | ICD-10-CM | POA: Diagnosis not present

## 2021-06-20 DIAGNOSIS — Z01419 Encounter for gynecological examination (general) (routine) without abnormal findings: Secondary | ICD-10-CM | POA: Diagnosis not present

## 2021-06-20 MED ORDER — ESTRADIOL 0.05 MG/24HR TD PTTW
MEDICATED_PATCH | TRANSDERMAL | 3 refills | Status: DC
  Filled 2021-06-20 – 2021-07-31 (×2): qty 24, 84d supply, fill #0
  Filled 2021-10-29: qty 24, 84d supply, fill #1
  Filled 2022-02-17: qty 24, 84d supply, fill #2
  Filled 2022-05-26: qty 24, 84d supply, fill #3

## 2021-06-20 MED ORDER — ESTRADIOL 0.075 MG/24HR TD PTTW
MEDICATED_PATCH | TRANSDERMAL | 3 refills | Status: AC
  Filled 2021-06-20: qty 24, 84d supply, fill #0

## 2021-06-26 ENCOUNTER — Other Ambulatory Visit (HOSPITAL_COMMUNITY): Payer: Self-pay

## 2021-06-28 ENCOUNTER — Other Ambulatory Visit (HOSPITAL_COMMUNITY): Payer: Self-pay

## 2021-06-29 ENCOUNTER — Other Ambulatory Visit (HOSPITAL_COMMUNITY): Payer: Self-pay

## 2021-07-31 ENCOUNTER — Other Ambulatory Visit (HOSPITAL_COMMUNITY): Payer: Self-pay

## 2021-10-29 ENCOUNTER — Other Ambulatory Visit (HOSPITAL_COMMUNITY): Payer: Self-pay

## 2021-10-30 ENCOUNTER — Other Ambulatory Visit (HOSPITAL_COMMUNITY): Payer: Self-pay

## 2022-02-17 ENCOUNTER — Other Ambulatory Visit (HOSPITAL_COMMUNITY): Payer: Self-pay

## 2022-04-14 ENCOUNTER — Other Ambulatory Visit: Payer: Self-pay | Admitting: Obstetrics and Gynecology

## 2022-04-14 DIAGNOSIS — Z1231 Encounter for screening mammogram for malignant neoplasm of breast: Secondary | ICD-10-CM

## 2022-05-08 ENCOUNTER — Ambulatory Visit
Admission: RE | Admit: 2022-05-08 | Discharge: 2022-05-08 | Disposition: A | Discharge status: Home / Self Care | Payer: Federal, State, Local not specified - PPO | Source: Ambulatory Visit | Attending: Obstetrics and Gynecology | Admitting: Obstetrics and Gynecology

## 2022-05-08 DIAGNOSIS — Z1231 Encounter for screening mammogram for malignant neoplasm of breast: Secondary | ICD-10-CM

## 2022-05-26 ENCOUNTER — Other Ambulatory Visit (HOSPITAL_COMMUNITY): Payer: Self-pay

## 2022-07-15 ENCOUNTER — Other Ambulatory Visit (HOSPITAL_COMMUNITY): Payer: Self-pay

## 2022-07-15 DIAGNOSIS — D225 Melanocytic nevi of trunk: Secondary | ICD-10-CM | POA: Diagnosis not present

## 2022-07-15 DIAGNOSIS — L578 Other skin changes due to chronic exposure to nonionizing radiation: Secondary | ICD-10-CM | POA: Diagnosis not present

## 2022-07-15 DIAGNOSIS — L219 Seborrheic dermatitis, unspecified: Secondary | ICD-10-CM | POA: Diagnosis not present

## 2022-07-15 DIAGNOSIS — D2371 Other benign neoplasm of skin of right lower limb, including hip: Secondary | ICD-10-CM | POA: Diagnosis not present

## 2022-07-15 MED ORDER — FLUOCINONIDE 0.05 % EX SOLN
CUTANEOUS | 1 refills | Status: AC
  Filled 2022-07-15: qty 60, 30d supply, fill #0
  Filled 2022-08-06: qty 60, 15d supply, fill #0

## 2022-07-23 ENCOUNTER — Other Ambulatory Visit (HOSPITAL_COMMUNITY): Payer: Self-pay

## 2022-07-31 ENCOUNTER — Other Ambulatory Visit (HOSPITAL_COMMUNITY): Payer: Self-pay

## 2022-07-31 DIAGNOSIS — Z6825 Body mass index (BMI) 25.0-25.9, adult: Secondary | ICD-10-CM | POA: Diagnosis not present

## 2022-07-31 DIAGNOSIS — Z01419 Encounter for gynecological examination (general) (routine) without abnormal findings: Secondary | ICD-10-CM | POA: Diagnosis not present

## 2022-07-31 MED ORDER — ESTRADIOL 0.05 MG/24HR TD PTTW
MEDICATED_PATCH | TRANSDERMAL | 3 refills | Status: DC
  Filled 2022-07-31: qty 24, 84d supply, fill #0
  Filled 2022-10-16: qty 24, 84d supply, fill #1
  Filled 2023-02-23: qty 24, 84d supply, fill #2
  Filled 2023-05-12: qty 24, 84d supply, fill #3

## 2022-08-06 ENCOUNTER — Other Ambulatory Visit (HOSPITAL_COMMUNITY): Payer: Self-pay

## 2022-09-20 IMAGING — MG MM DIGITAL SCREENING BILAT W/ TOMO AND CAD
8 series · 9 of 24 positions shown · non-contrast
Comparison: Previous exam(s).

CLINICAL DATA: Screening.

EXAM:
DIGITAL SCREENING BILATERAL MAMMOGRAM WITH TOMOSYNTHESIS AND CAD
TECHNIQUE: Bilateral screening digital craniocaudal and mediolateral oblique
mammograms were obtained. Bilateral screening digital breast
tomosynthesis was performed. The images were evaluated with
computer-aided detection.

[R MLO synth-2D]
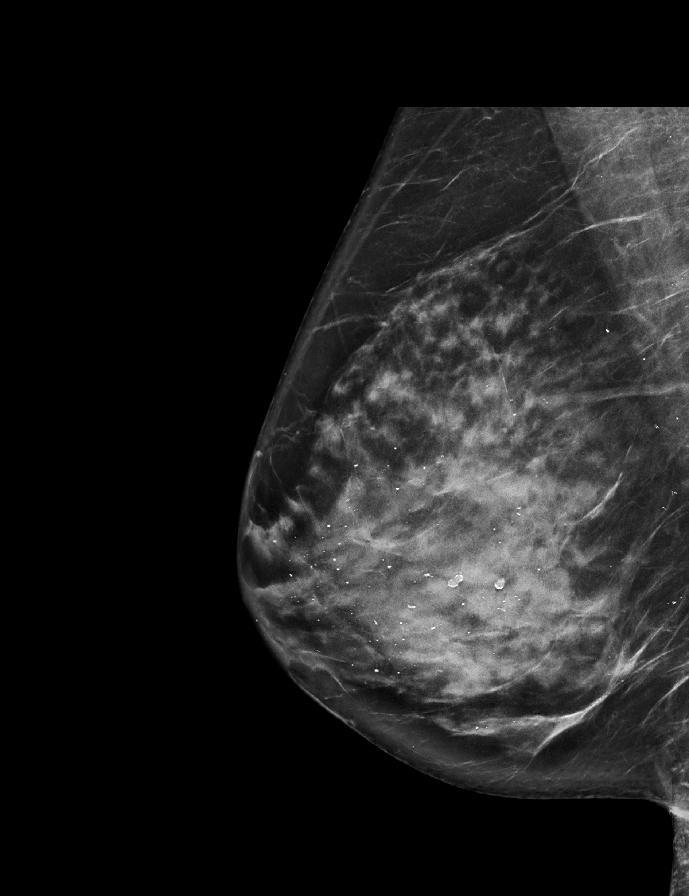

[L MLO synth-2D]
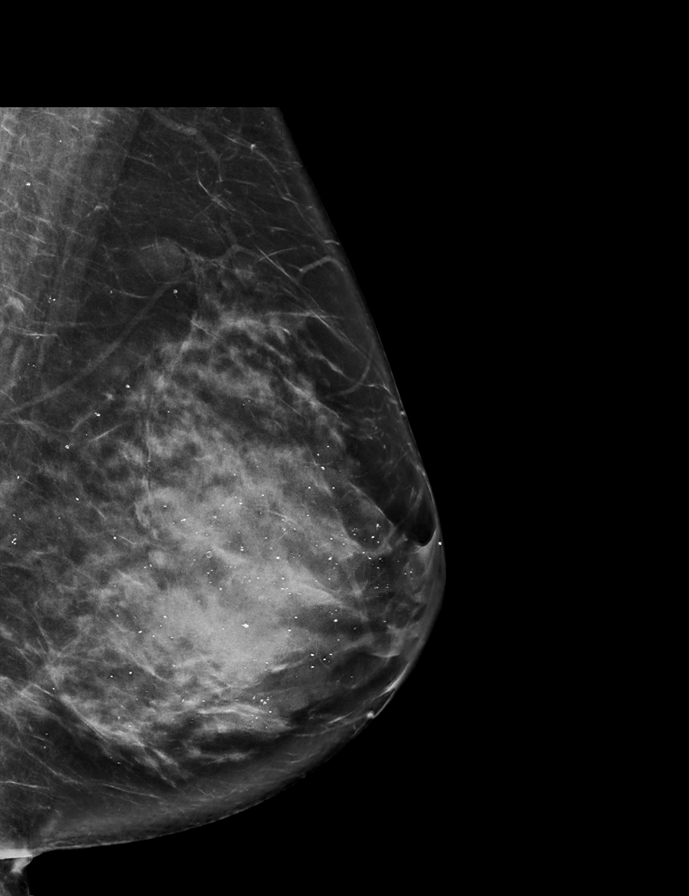

[L CC synth-2D]
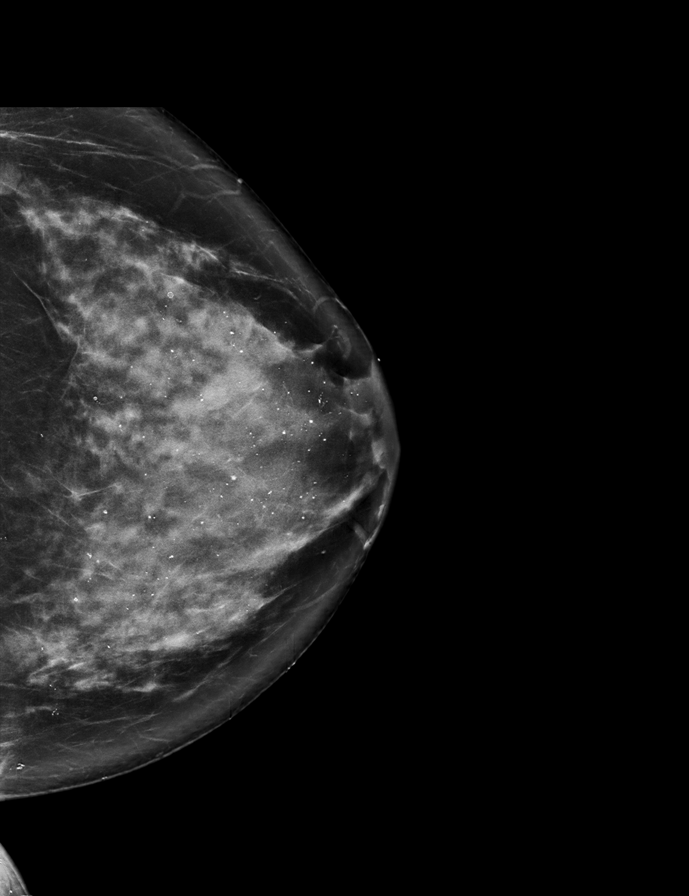

[R CC synth-2D]
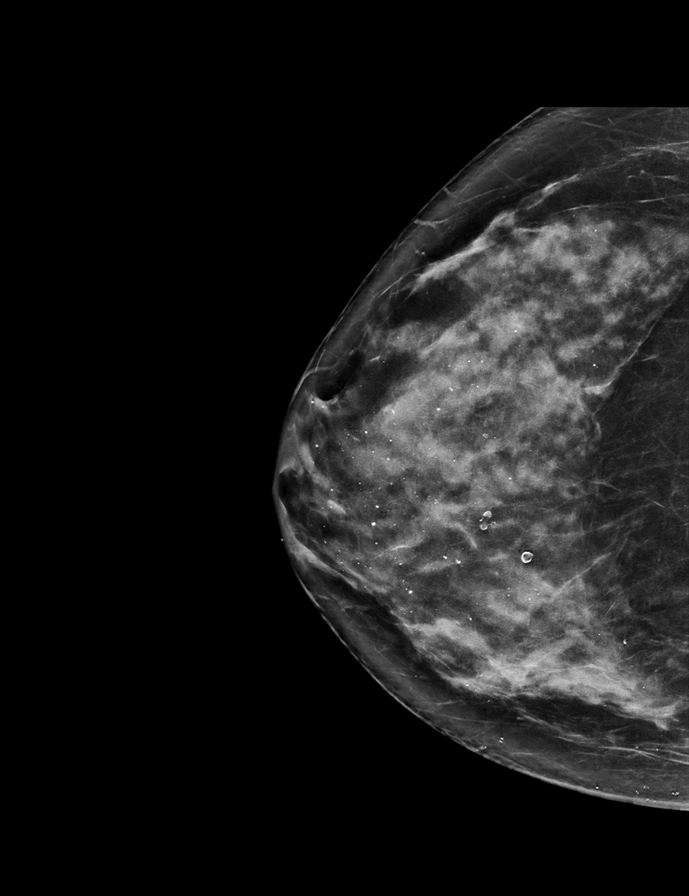

[R MLO tomo · 2 of 78 frames shown]
[frame 26/78]
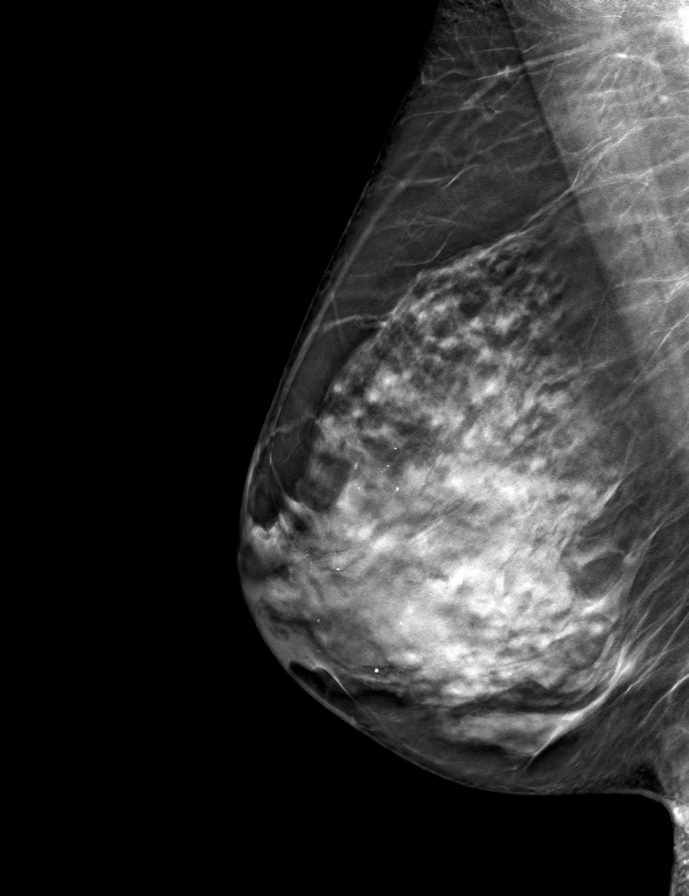
[frame 39/78]
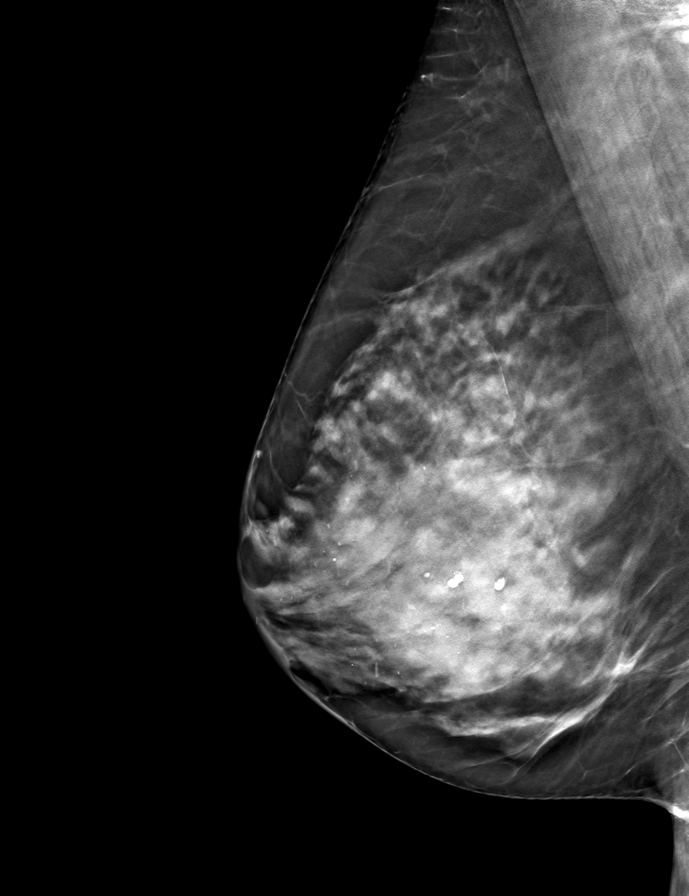

[L MLO tomo · tomo slice 39/76.0]
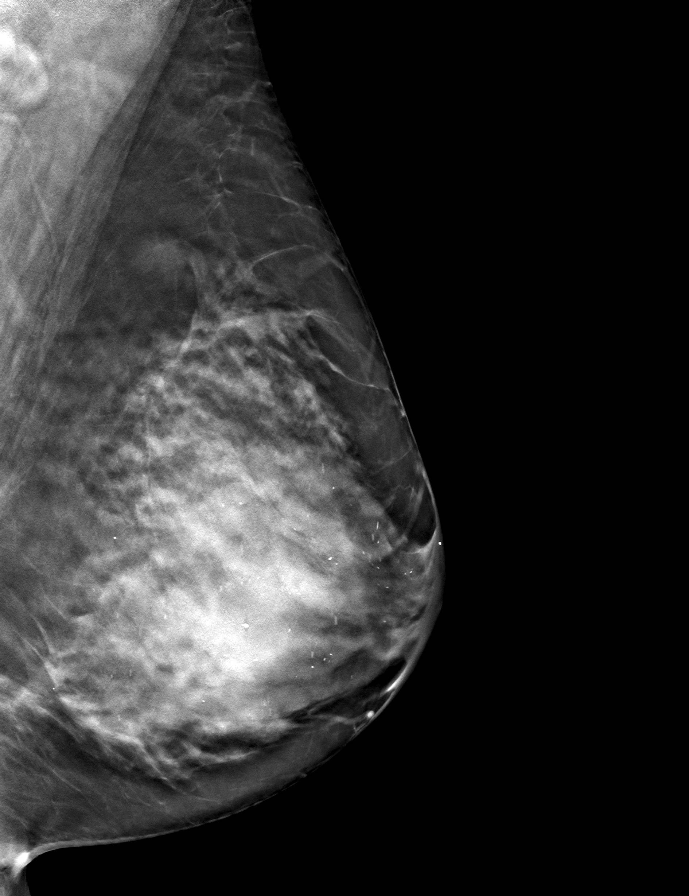

[L CC tomo · tomo slice 41/81.0]
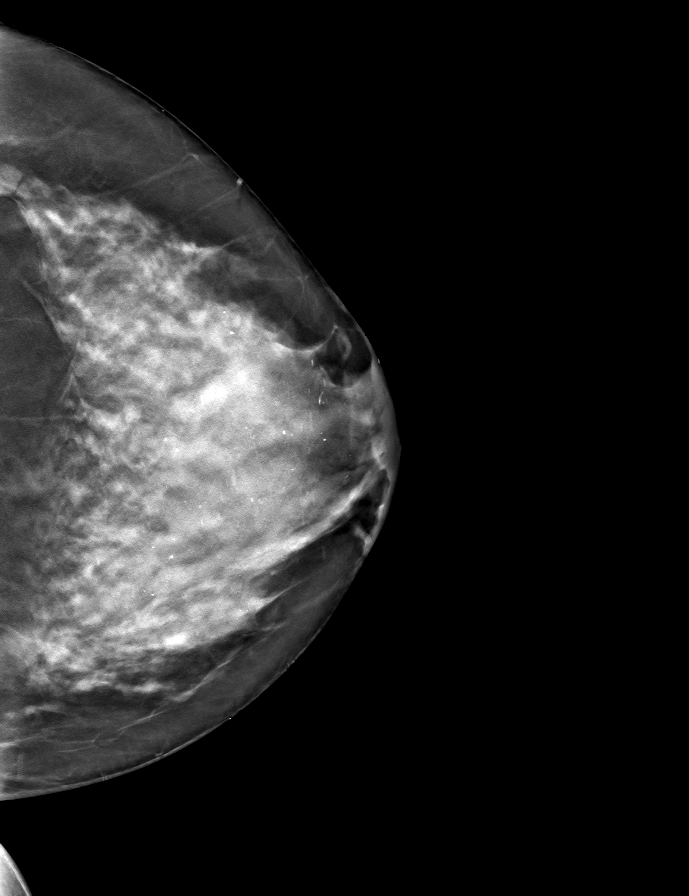

[R CC tomo · tomo slice 39/77.0]
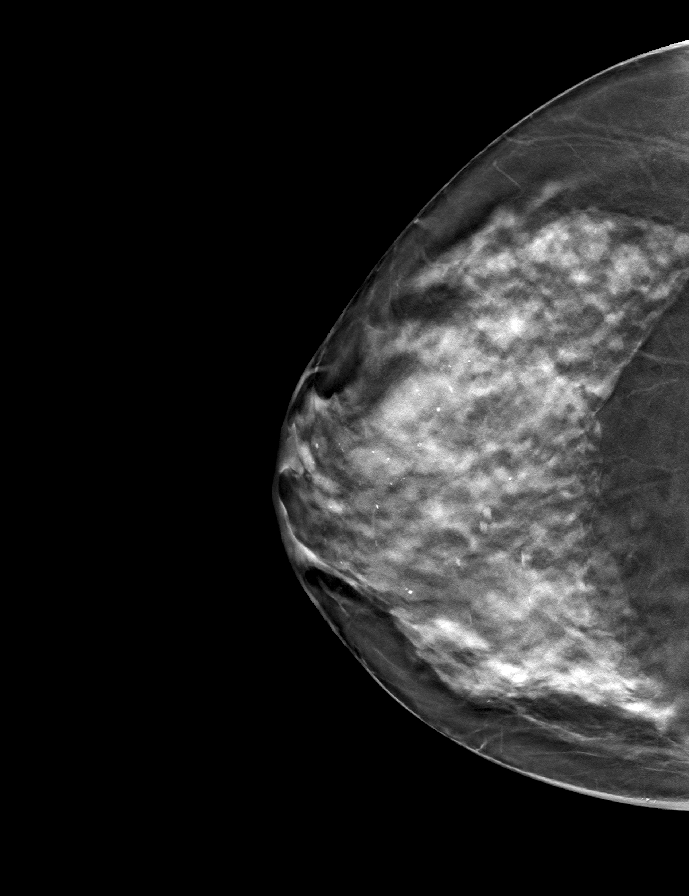

[9 of 24 positions shown; findings below may reference images not displayed]

ACR Breast Density Category d: The breast tissue is extremely dense,
which lowers the sensitivity of mammography.
FINDINGS: In the left breast, a possible mass warrants further evaluation. In
the right breast, no findings suspicious for malignancy.
IMPRESSION: Further evaluation is suggested for a possible mass in the left
breast.

RECOMMENDATION:
Diagnostic mammogram and possibly ultrasound of the left breast.
(Code:VD-B-HHI)

The patient will be contacted regarding the findings, and additional
imaging will be scheduled.

BI-RADS CATEGORY  0: Incomplete. Need additional imaging evaluation
and/or prior mammograms for comparison.

## 2022-10-12 IMAGING — US US BREAST*L* LIMITED INC AXILLA
1 series · 7 of 7 positions shown · non-contrast
Comparison: Previous exam(s).

CLINICAL DATA: Patient recalled from screening for left breast
mass.

EXAM:
DIGITAL DIAGNOSTIC UNILATERAL LEFT MAMMOGRAM WITH TOMOSYNTHESIS AND
CAD; ULTRASOUND LEFT BREAST LIMITED
TECHNIQUE: Left digital diagnostic mammography and breast tomosynthesis was
performed. The images were evaluated with computer-aided detection.;
Targeted ultrasound examination of the left breast was performed

[Series 1: us breast*left* limited inc axilla · 0.07mm/px · 7 of 7 slices shown]
[im 1/7]
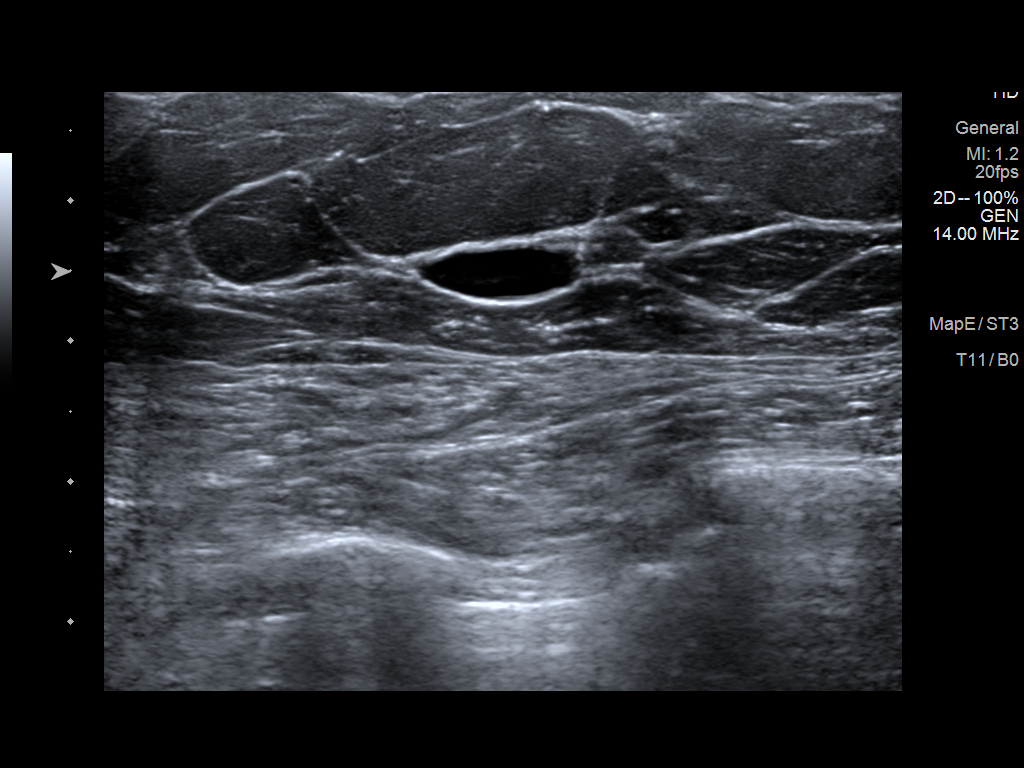
[im 2/7]
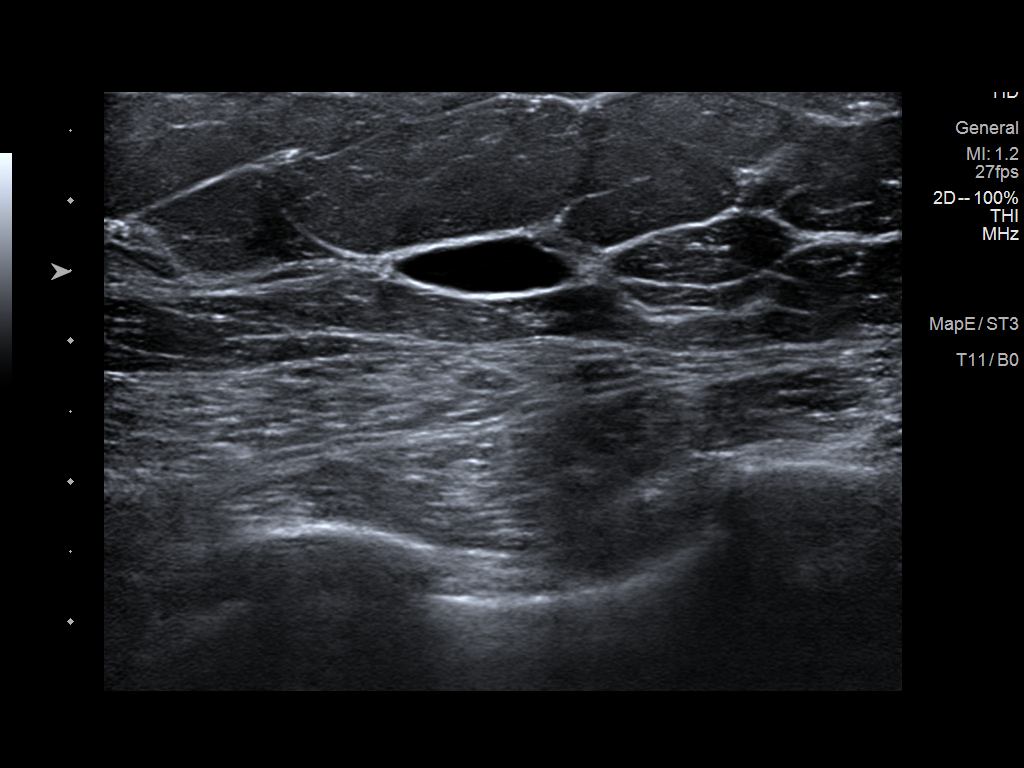
[im 3/7]
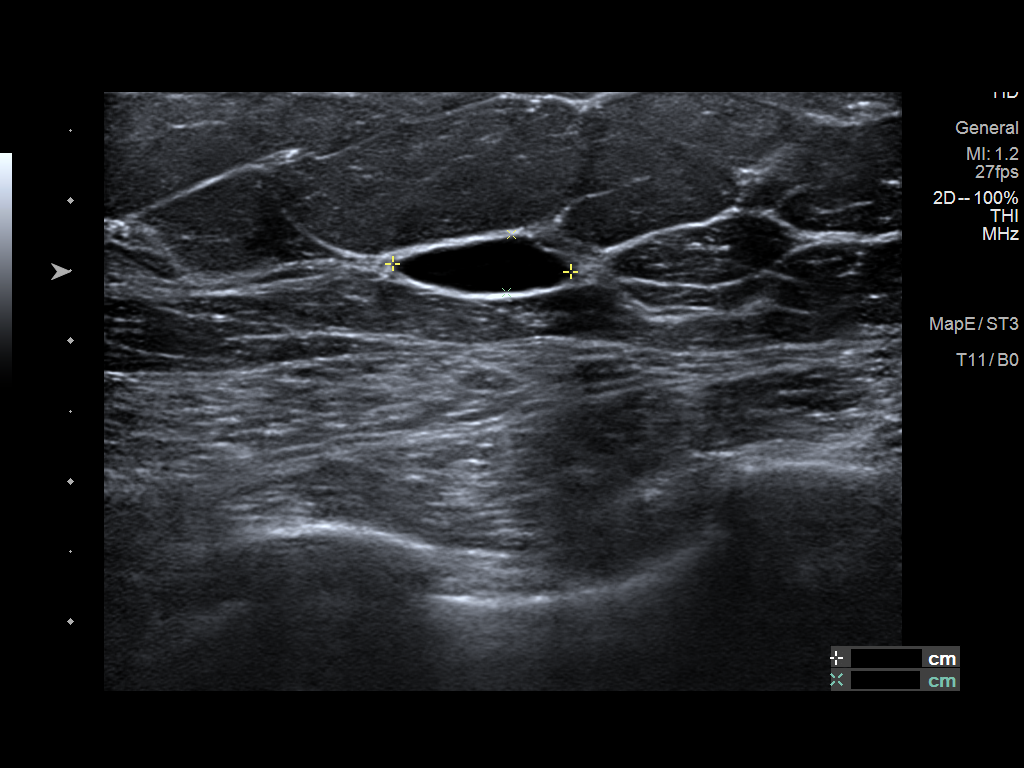
[im 4/7]
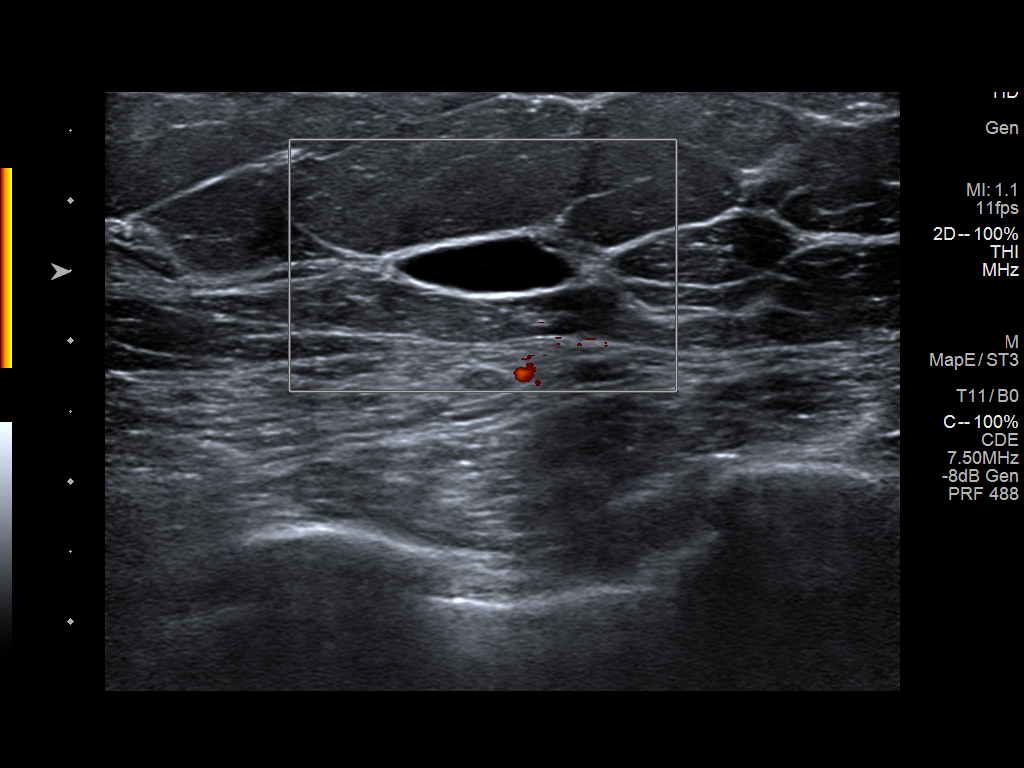
[im 5/7]
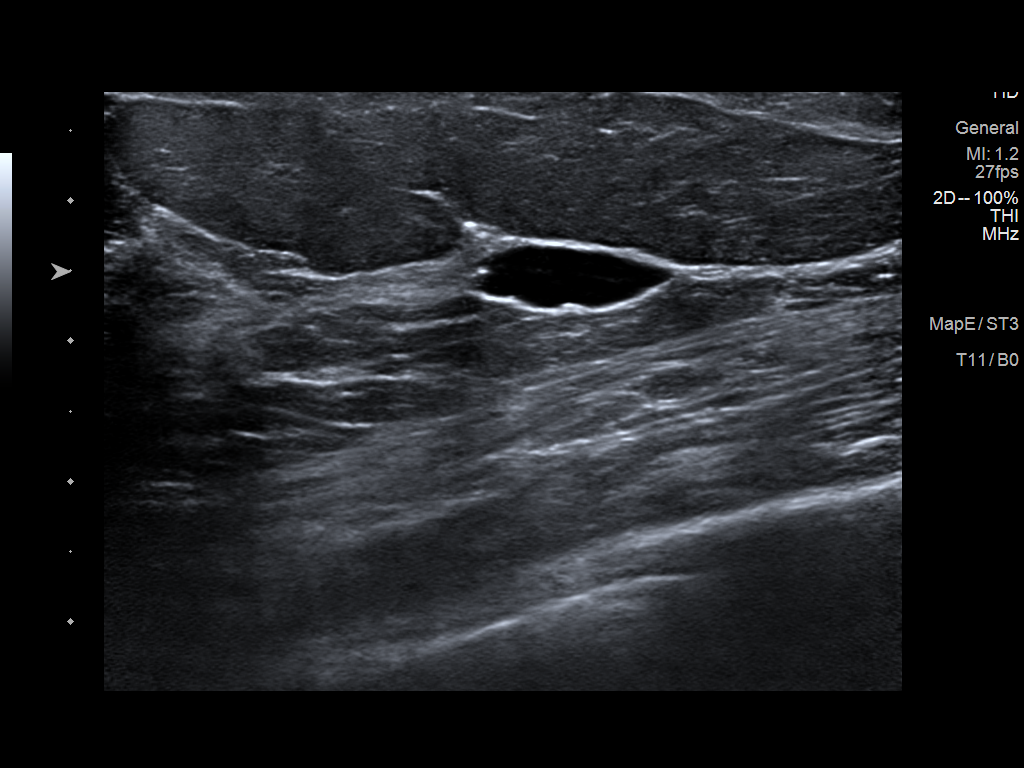
[im 6/7]
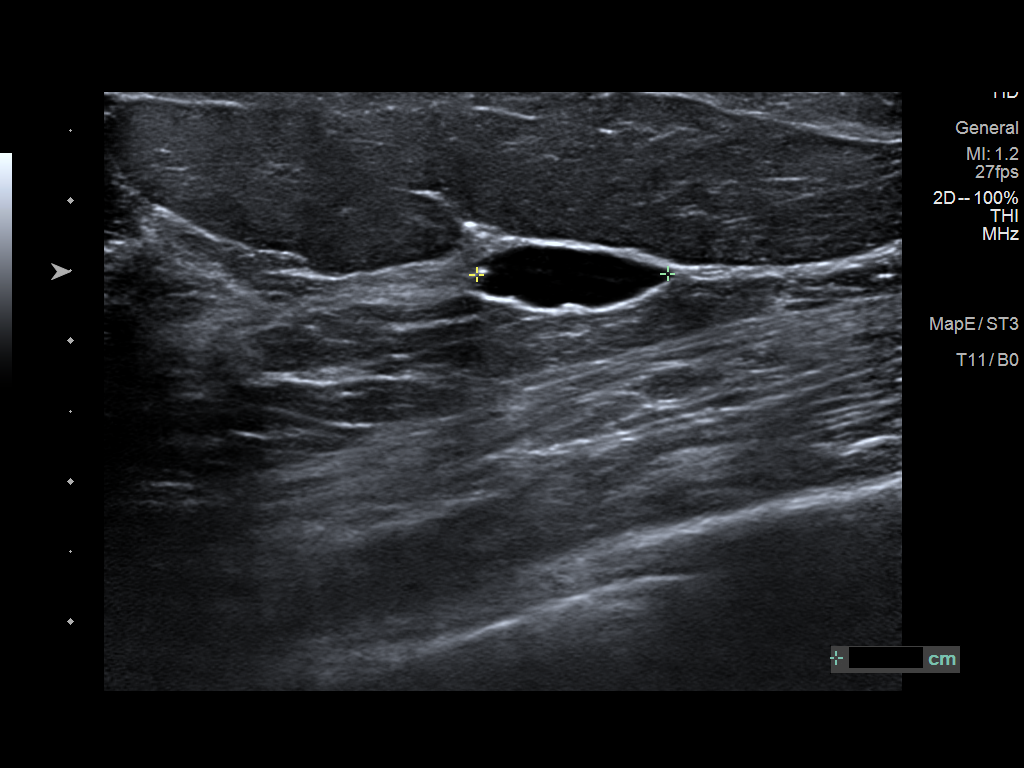
[im 7/7]
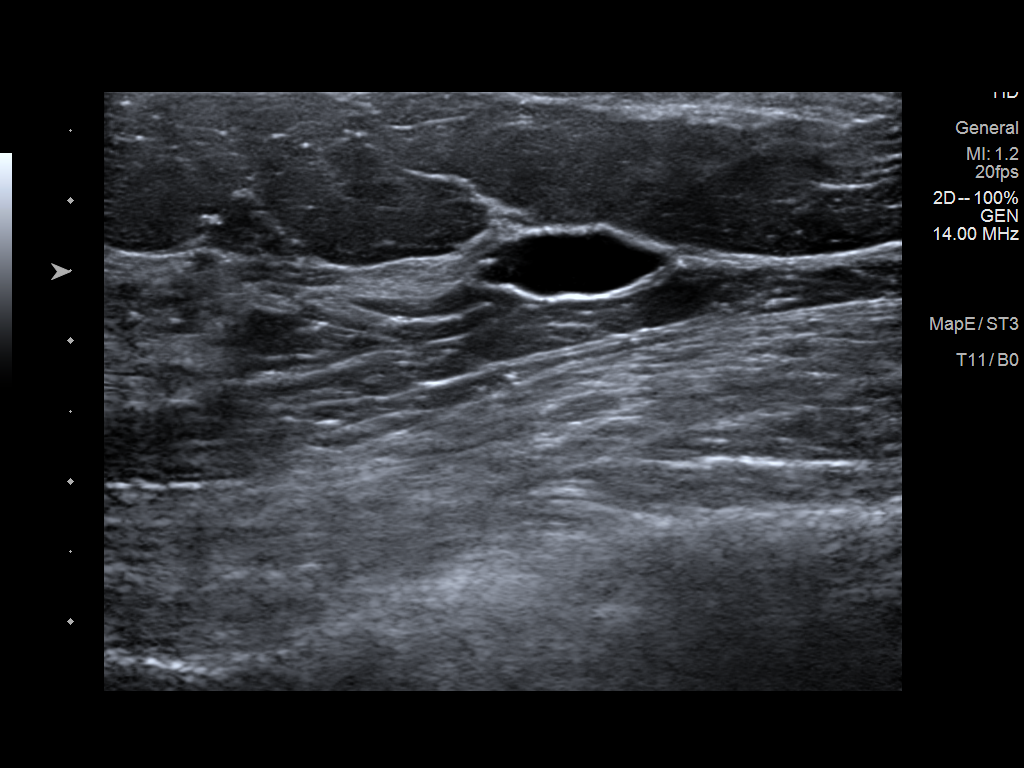

[7 of 7 positions shown; findings below may reference images not displayed]

ACR Breast Density Category c: The breast tissue is heterogeneously
dense, which may obscure small masses.
FINDINGS: Persistent oval circumscribed mass within the upper-outer left
breast.

Targeted ultrasound is performed, showing a 14 x 13 x 4 mm cyst left
breast 2 o'clock position 7 cm from nipple.
IMPRESSION: Left breast cyst.

RECOMMENDATION:
Screening mammogram in one year.(Code:F8-2-06V)

I have discussed the findings and recommendations with the patient.
If applicable, a reminder letter will be sent to the patient
regarding the next appointment.

BI-RADS CATEGORY  2: Benign.

## 2022-10-16 ENCOUNTER — Other Ambulatory Visit (HOSPITAL_COMMUNITY): Payer: Self-pay

## 2022-12-04 DIAGNOSIS — Z0000 Encounter for general adult medical examination without abnormal findings: Secondary | ICD-10-CM | POA: Diagnosis not present

## 2022-12-04 DIAGNOSIS — Z13220 Encounter for screening for lipoid disorders: Secondary | ICD-10-CM | POA: Diagnosis not present

## 2023-02-23 ENCOUNTER — Other Ambulatory Visit (HOSPITAL_COMMUNITY): Payer: Self-pay

## 2023-05-12 ENCOUNTER — Other Ambulatory Visit (HOSPITAL_COMMUNITY): Payer: Self-pay

## 2023-05-12 MED ORDER — PEG 3350-KCL-NA BICARB-NACL 420 G PO SOLR
ORAL | 0 refills | Status: AC
  Filled 2023-05-12: qty 4000, 1d supply, fill #0

## 2023-05-21 DIAGNOSIS — Z86010 Personal history of colonic polyps: Secondary | ICD-10-CM | POA: Diagnosis not present

## 2023-05-21 DIAGNOSIS — Z09 Encounter for follow-up examination after completed treatment for conditions other than malignant neoplasm: Secondary | ICD-10-CM | POA: Diagnosis not present

## 2023-05-21 DIAGNOSIS — D123 Benign neoplasm of transverse colon: Secondary | ICD-10-CM | POA: Diagnosis not present

## 2023-06-12 ENCOUNTER — Other Ambulatory Visit: Payer: Self-pay | Admitting: Obstetrics and Gynecology

## 2023-06-12 DIAGNOSIS — Z1231 Encounter for screening mammogram for malignant neoplasm of breast: Secondary | ICD-10-CM

## 2023-06-23 ENCOUNTER — Ambulatory Visit
Admission: RE | Admit: 2023-06-23 | Discharge: 2023-06-23 | Disposition: A | Discharge status: Home / Self Care | Payer: Federal, State, Local not specified - PPO | Source: Ambulatory Visit | Attending: Obstetrics and Gynecology | Admitting: Obstetrics and Gynecology

## 2023-06-23 DIAGNOSIS — Z1231 Encounter for screening mammogram for malignant neoplasm of breast: Secondary | ICD-10-CM

## 2023-08-13 DIAGNOSIS — Z01419 Encounter for gynecological examination (general) (routine) without abnormal findings: Secondary | ICD-10-CM | POA: Diagnosis not present

## 2023-08-13 DIAGNOSIS — Z6826 Body mass index (BMI) 26.0-26.9, adult: Secondary | ICD-10-CM | POA: Diagnosis not present

## 2023-08-20 DIAGNOSIS — Z23 Encounter for immunization: Secondary | ICD-10-CM | POA: Diagnosis not present

## 2023-08-31 ENCOUNTER — Other Ambulatory Visit (HOSPITAL_COMMUNITY): Payer: Self-pay

## 2023-08-31 MED ORDER — ESTRADIOL 0.05 MG/24HR TD PTTW
MEDICATED_PATCH | TRANSDERMAL | 3 refills | Status: DC
  Filled 2023-08-31: qty 24, 84d supply, fill #0
  Filled 2023-12-21: qty 24, 84d supply, fill #1
  Filled 2024-04-04: qty 24, 84d supply, fill #2
  Filled 2024-07-27: qty 24, 84d supply, fill #3

## 2023-10-06 ENCOUNTER — Other Ambulatory Visit: Payer: Self-pay

## 2023-12-02 DIAGNOSIS — M21621 Bunionette of right foot: Secondary | ICD-10-CM | POA: Diagnosis not present

## 2023-12-02 DIAGNOSIS — M2011 Hallux valgus (acquired), right foot: Secondary | ICD-10-CM | POA: Diagnosis not present

## 2023-12-16 DIAGNOSIS — Q72812 Congenital shortening of left lower limb: Secondary | ICD-10-CM | POA: Diagnosis not present

## 2023-12-16 DIAGNOSIS — M9905 Segmental and somatic dysfunction of pelvic region: Secondary | ICD-10-CM | POA: Diagnosis not present

## 2023-12-16 DIAGNOSIS — M2141 Flat foot [pes planus] (acquired), right foot: Secondary | ICD-10-CM | POA: Diagnosis not present

## 2023-12-16 DIAGNOSIS — M2142 Flat foot [pes planus] (acquired), left foot: Secondary | ICD-10-CM | POA: Diagnosis not present

## 2023-12-22 ENCOUNTER — Other Ambulatory Visit (HOSPITAL_COMMUNITY): Payer: Self-pay

## 2024-04-04 ENCOUNTER — Other Ambulatory Visit: Payer: Self-pay

## 2024-05-19 ENCOUNTER — Other Ambulatory Visit: Payer: Self-pay | Admitting: Family Medicine

## 2024-05-19 DIAGNOSIS — R2689 Other abnormalities of gait and mobility: Secondary | ICD-10-CM | POA: Diagnosis not present

## 2024-05-19 DIAGNOSIS — E7800 Pure hypercholesterolemia, unspecified: Secondary | ICD-10-CM | POA: Diagnosis not present

## 2024-05-23 ENCOUNTER — Ambulatory Visit
Admission: RE | Admit: 2024-05-23 | Discharge: 2024-05-23 | Disposition: A | Discharge status: Home / Self Care | Source: Ambulatory Visit | Attending: Family Medicine | Admitting: Family Medicine

## 2024-05-23 DIAGNOSIS — R9082 White matter disease, unspecified: Secondary | ICD-10-CM | POA: Diagnosis not present

## 2024-05-23 DIAGNOSIS — R2689 Other abnormalities of gait and mobility: Secondary | ICD-10-CM

## 2024-05-23 MED ORDER — GADOPICLENOL 0.5 MMOL/ML IV SOLN
6.0000 mL | Freq: Once | INTRAVENOUS | Status: AC | PRN
  Administered 2024-05-23: 6 mL via INTRAVENOUS

## 2024-05-24 ENCOUNTER — Encounter: Payer: Self-pay | Admitting: Neurology

## 2024-06-20 ENCOUNTER — Other Ambulatory Visit: Payer: Self-pay | Admitting: Obstetrics and Gynecology

## 2024-06-20 DIAGNOSIS — Z1231 Encounter for screening mammogram for malignant neoplasm of breast: Secondary | ICD-10-CM

## 2024-07-11 ENCOUNTER — Ambulatory Visit: Admitting: Neurology

## 2024-07-11 ENCOUNTER — Encounter: Payer: Self-pay | Admitting: Neurology

## 2024-07-11 VITALS — BP 106/70 | HR 70 | Ht 60.0 in | Wt 134.0 lb

## 2024-07-11 DIAGNOSIS — R258 Other abnormal involuntary movements: Secondary | ICD-10-CM | POA: Diagnosis not present

## 2024-07-11 DIAGNOSIS — R292 Abnormal reflex: Secondary | ICD-10-CM

## 2024-07-11 DIAGNOSIS — R2681 Unsteadiness on feet: Secondary | ICD-10-CM

## 2024-07-11 NOTE — Progress Notes (Signed)
 [facility] Clinic Note - Initial Visit   Date: 07/11/2024   Taylor Holloway MRN: [id number] DOB: 1961-08-14   Dear Dr. Verena:  Thank you for your kind referral of Taylor Holloway for consultation of falls. Although her history is well known to you, please allow us  to reiterate it for the purpose of our medical record. The patient was accompanied to the clinic by husband who also provides collateral information.     Taylor Holloway is a 62 y.o. right-handed female presenting for evaluation of falls.   IMPRESSION/PLAN: Gait imbalance and falls.  MRI brian was personally reviewed and does not show any abnormalities to explain her falls.  Her exam shows mild bradykinesia of the left lower leg with toe and heel tapping and reflexes are brisk at the knees.  Gait overall looks stable.  I discussed that we can perform additional testing to look at pathology in the lumbar spine.  Besides bradykinesia and gait instability, she does not have any tremor or rigidity to suggest parkinsonism.  I will continue to monitor for this and may consider DAT scan going forward.   - MRI lumbar spine wo contrast  - Start PT for balance training  Return to clinic in 3 months  ------------------------------------------------------------- History of present illness: Over the past 6 months, she has several falls which predominately have involved steps.  She reports missing steps or miscalculating the depth of the step which caused her to call.  Husband also reports that she tends to be half an inch off when trying to placing items on the counter or in the dish washer.  She has more difficulty balancing on her left leg and feels that the left leg can be a little weaker. She denies numbness/tingling or low back pain.   She denies imbalance on flat ground.    Out-side paper records, electronic medical record, and images have been reviewed where available and summarized as:  MRI brain wo  contrast 05/23/2024: 1. No acute intracranial abnormality. 2. 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   Past Medical History:  Diagnosis Date   GERD (gastroesophageal reflux disease)    No pertinent past medical history    PONV (postoperative nausea and vomiting)     Past Surgical History:  Procedure Laterality Date   ABDOMINAL HYSTERECTOMY  11/13/2011   Procedure: HYSTERECTOMY ABDOMINAL;  Surgeon: Charlie CHRISTELLA Croak, MD;  Location: [facility];  Service: Gynecology;  Laterality: Left;  abdominal hysterectomy with left salpingo-ophoorectomy   ANTERIOR AND POSTERIOR REPAIR  11/13/2011   Procedure: ANTERIOR (CYSTOCELE) AND POSTERIOR REPAIR (RECTOCELE);  Surgeon: Glendia DELENA Elizabeth, MD;  Location: [facility];  Service: Urology;  Laterality: N/A;   DIAGNOSTIC LAPAROSCOPY  1990   LAPAROSCOPIC ASSISTED VAGINAL HYSTERECTOMY  11/13/2011   Procedure: LAPAROSCOPIC ASSISTED VAGINAL HYSTERECTOMY;  Surgeon: Charlie CHRISTELLA Croak, MD;  Location: [facility];  Service: Gynecology;  Laterality: N/A;  attempted   PUBOVAGINAL SLING  11/13/2011   Procedure: CARLOYN GLADE;  Surgeon: Glendia DELENA Elizabeth, MD;  Location: [facility];  Service: Urology;  Laterality: N/A;  sparc sling     Medications:  Outpatient Encounter Medications as of 07/11/2024  Medication Sig   cholecalciferol (VITAMIN D3) 25 MCG (1000 UNIT) tablet Take 1,000 Units by mouth daily.   estradiol  (DOTTI ) 0.05 MG/24HR patch Apply 1 patch to skin twice a week   Multiple Vitamin (MULITIVITAMIN  WITH MINERALS) TABS Take 1 tablet by mouth daily.     Multiple Vitamins-Minerals (HAIR/SKIN/NAILS) TABS Take by mouth.   azithromycin  (ZITHROMAX ) 500 MG tablet Take 1 tablet (500 mg total) by mouth daily. (Patient not taking: Reported on 07/11/2024)   chlorpheniramine-HYDROcodone  (TUSSIONEX PENNKINETIC ER) 10-8 MG/5ML LQCR Take 5 mLs by mouth every  12 (twelve) hours as needed for cough. (Patient not taking: Reported on 07/11/2024)   estradiol  (DOTTI ) 0.075 MG/24HR APPLY 1 PATCH TO SKIN TWICE WEEKLY (Patient not taking: Reported on 07/11/2024)   estradiol  (VIVELLE -DOT) 0.1 MG/24HR Place 1 patch onto the skin 2 (two) times a week. Changes patch on Tuesdays and Saturdays  (Patient not taking: Reported on 07/11/2024)   fluocinonide  (LIDEX ) 0.05 % external solution Apply to the scalp once daily as needed. (Patient not taking: Reported on 07/11/2024)   Omega-3 Fatty Acids (FISH OIL PO) Take by mouth. (Patient not taking: Reported on 07/11/2024)   polyethylene glycol-electrolytes (NULYTELY) 420 g solution use as directed (Patient not taking: Reported on 07/11/2024)   VITAMIN E PO Take by mouth. (Patient not taking: Reported on 07/11/2024)   No facility-administered encounter medications on file as of 07/11/2024.    Allergies: No Known Allergies  Family History: Family History  Problem Relation Age of Onset   Kidney disease Mother    Diabetes Mother    Stroke Mother    Hypertension Mother    Other Father        Passed away from Covid 08/02/2019   Hypertension Father    Diabetes Father    Kidney disease Father    Hyperlipidemia Other    Hypertension Other    Diabetes Other    Stroke Other    Breast cancer Neg Hx     Social History: Social History   Tobacco Use   Smoking status: Former   Smokeless tobacco: Never  Substance Use Topics   Alcohol use: Yes    Alcohol/week: 1.0 standard drink of alcohol    Types: 1 Glasses of wine per week    Comment: Occasiona Drink 1-2 times a year   Drug use: No   Social History   Social History Narrative   Are you right handed or left handed? Right handed    Are you currently employed ? 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 No    Who lives with you? Husband    What type of home do you live in: 1 story or 2 story? Lives in a two story home. No pets          Vital Signs:  BP 106/70   Pulse 70   Ht 5' (1.524 m)   Wt 134 lb (60.8 kg)   SpO2 98%   BMI 26.17 kg/m   Neurological Exam: MENTAL STATUS including orientation to time, place, person, recent and remote memory, attention span and concentration, language, and fund of knowledge is normal.  Speech is not dysarthric.  CRANIAL NERVES: II:  No visual field defects.     III-IV-VI: Pupils equal round and reactive to light.  Normal conjugate, extra-ocular eye movements in all directions of gaze.  No nystagmus.  No ptosis.   V:  Normal facial sensation.    VII:  Normal facial symmetry and movements.   VIII:  Normal hearing and vestibular function.   IX-X:  Normal palatal movement.   XI:  Normal shoulder shrug and head  rotation.   XII:  Normal tongue strength and range of motion, no deviation or fasciculation.  MOTOR:  No atrophy, fasciculations or abnormal movements.  No pronator drift.   Upper Extremity:  Right  Left  Deltoid  5/5   5/5   Biceps  5/5   5/5   Triceps  5/5   5/5   Wrist extensors  5/5   5/5   Wrist flexors  5/5   5/5   Finger extensors  5/5   5/5   Finger flexors  5/5   5/5   Dorsal interossei  5/5   5/5   Abductor pollicis  5/5   5/5   Tone (Ashworth scale)  0  0   Lower Extremity:  Right  Left  Hip flexors  5/5   5/5   Knee flexors  5/5   5/5   Knee extensors  5/5   5/5   Dorsiflexors  5/5   5/5   Plantarflexors  5/5   5/5   Toe extensors  5/5   5/5   Toe flexors  5/5   5/5   Tone (Ashworth scale)  0  0   MSRs:                                           Right        Left brachioradialis 2+  2+  biceps 2+  2+  triceps 2+  2+  patellar 3+  3+  ankle jerk 2+  2+  Hoffman no  no  plantar response down  down   SENSORY:  Normal and symmetric perception of light touch, pinprick, vibration, and temperature.  Romberg's sign absent.   COORDINATION/GAIT: Normal finger-to- nose-finger.  Finger tapping is intact.  Heel tapping and toe tapping is mildly  slowed on the left.  Gait narrow based and stable. Tandem and stressed gait intact.      Thank you for allowing me to participate in patient's care.  If I can answer any additional questions, I would be pleased to do so.    Sincerely,    Pecolia Marando K. Tobie, DO

## 2024-07-11 NOTE — Patient Instructions (Signed)
 MRI lumbar spine   Referral to physical therapy

## 2024-07-15 ENCOUNTER — Ambulatory Visit: Attending: Neurology

## 2024-07-15 ENCOUNTER — Other Ambulatory Visit: Payer: Self-pay

## 2024-07-15 DIAGNOSIS — R258 Other abnormal involuntary movements: Secondary | ICD-10-CM | POA: Diagnosis not present

## 2024-07-15 DIAGNOSIS — Z9181 History of falling: Secondary | ICD-10-CM | POA: Insufficient documentation

## 2024-07-15 DIAGNOSIS — R293 Abnormal posture: Secondary | ICD-10-CM | POA: Diagnosis not present

## 2024-07-15 DIAGNOSIS — R2689 Other abnormalities of gait and mobility: Secondary | ICD-10-CM | POA: Insufficient documentation

## 2024-07-15 DIAGNOSIS — R2681 Unsteadiness on feet: Secondary | ICD-10-CM | POA: Insufficient documentation

## 2024-07-15 DIAGNOSIS — R262 Difficulty in walking, not elsewhere classified: Secondary | ICD-10-CM | POA: Insufficient documentation

## 2024-07-15 DIAGNOSIS — M6281 Muscle weakness (generalized): Secondary | ICD-10-CM | POA: Insufficient documentation

## 2024-07-15 DIAGNOSIS — R252 Cramp and spasm: Secondary | ICD-10-CM | POA: Diagnosis not present

## 2024-07-15 DIAGNOSIS — R292 Abnormal reflex: Secondary | ICD-10-CM | POA: Diagnosis not present

## 2024-07-15 NOTE — Therapy (Unsigned)
 OUTPATIENT PHYSICAL THERAPY LOWER EXTREMITY EVALUATION   Patient Name: Taylor Holloway MRN: [id number] DOB:[birth year], 63 y.o., female Today's Date: 07/16/2024  END OF SESSION:  PT End of Session - 07/15/24 1021     Visit Number 1    Date for PT Re-Evaluation 09/09/24    Authorization Type [employer]    Progress Note Due on Visit 10    PT Start Time 1021    PT Stop Time 1100    PT Time Calculation (min) 39 min          Past Medical History:  Diagnosis Date   GERD (gastroesophageal reflux disease)    No pertinent past medical history    PONV (postoperative nausea and vomiting)    Past Surgical History:  Procedure Laterality Date   ABDOMINAL HYSTERECTOMY  11/13/2011   Procedure: HYSTERECTOMY ABDOMINAL;  Surgeon: Charlie CHRISTELLA Croak, MD;  Location: [facility];  Service: Gynecology;  Laterality: Left;  abdominal hysterectomy with left salpingo-ophoorectomy   ANTERIOR AND POSTERIOR REPAIR  11/13/2011   Procedure: ANTERIOR (CYSTOCELE) AND POSTERIOR REPAIR (RECTOCELE);  Surgeon: Glendia DELENA Elizabeth, MD;  Location: [facility];  Service: Urology;  Laterality: N/A;   DIAGNOSTIC LAPAROSCOPY  1990   LAPAROSCOPIC ASSISTED VAGINAL HYSTERECTOMY  11/13/2011   Procedure: LAPAROSCOPIC ASSISTED VAGINAL HYSTERECTOMY;  Surgeon: Charlie CHRISTELLA Croak, MD;  Location: [facility];  Service: Gynecology;  Laterality: N/A;  attempted   PUBOVAGINAL SLING  11/13/2011   Procedure: CARLOYN GLADE;  Surgeon: Glendia DELENA Elizabeth, MD;  Location: [facility];  Service: Urology;  Laterality: N/A;  sparc sling   Patient Active Problem List   Diagnosis Date Noted   Cough 11/09/2013   Pneumonia, organism unspecified(486) 11/09/2013   GERD 03/26/2009    PCP: Dr. Verena  REFERRING PROVIDER: Tobie Tonita POUR, DO  REFERRING DIAG: R29.2 (ICD-10-CM) - Hyperreflexia R26.81 (ICD-10-CM) - Unsteady gait R25.8 (ICD-10-CM) - Bradykinesia  THERAPY DIAG:  Other abnormalities of gait and mobility  Muscle weakness (generalized)  History of  falling  Difficulty in walking, not elsewhere classified  Rationale for Evaluation and Treatment: Rehabilitation  ONSET DATE: 07/11/2024  SUBJECTIVE:   SUBJECTIVE STATEMENT: I had 4 falls within 6 months, 2 within weeks of one another.  I went to my primary doctor and they have done multiple studies with no abnormal findings.  Sent her for neuro consult.  No significant findings.  I am retired,  I go workout 3 times per week, we go out and about for various things.  No family close by.  We do travel some.  We were recently on a trip and were walking down a sidewalk and I fell when I caught my foot on the seam in the sidewalk.  I would like to work on my balance and walking to avoid any further falls.    PERTINENT HISTORY: na PAIN:  Are you having pain? Yes: NPRS scale: 1/10 Pain location: right low back and hip Pain description: aching Aggravating factors: standing Relieving factors: rest  PRECAUTIONS: Fall  RED FLAGS: None   WEIGHT BEARING RESTRICTIONS: No  FALLS:  Has patient fallen in last 6 months? Yes. Number of falls 4  LIVING ENVIRONMENT: Lives with: lives with their spouse Lives in: House/apartment Stairs: Yes: Internal: 16 steps; on right going up and External: 5 steps; on right going up Has following equipment at home: None  OCCUPATION: Retired Engineer, civil (consulting)  PLOF: Independent, Independent with basic ADLs, Independent with household mobility without device, Independent with community mobility without device, Independent with homemaking  with ambulation, Independent with gait, and Independent with transfers  PATIENT GOALS: I would like to work on my balance and walking to avoid any further falls.    NEXT MD VISIT: prn  OBJECTIVE:  Note: Objective measures were completed at Evaluation unless otherwise noted.  DIAGNOSTIC FINDINGS: all diagnostics normal  COGNITION: Overall cognitive status: Within functional limits for tasks assessed     SENSATION: WFL  MUSCLE  LENGTH: Hamstrings: Right 55 deg; Left 60 deg Thomas test: Right pos; Left pos  POSTURE: No Significant postural limitations  PALPATION: Slight lumbar scoliosis noted: trunk shift to right  LOWER EXTREMITY ROM:  WNL  LOWER EXTREMITY MMT:  Generally 4+/5 with exception of hip abduction : right 4-/5, left 4/5,  hip ER right 3+/5, left 4-/5, hip extension 4-/5 bilaterally   FUNCTIONAL TESTS:  5 times sit to stand: 13.27 sec Timed up and go (TUG): 7.55 sec  GAIT: Distance walked: 100 feet Assistive device utilized: None Level of assistance: Complete Independence Comments: Mild trendelenburg but good step length and heel strike                                                                                                                                TREATMENT DATE:  07/15/24  Initial eval completed and initiated HEP Educated patient on various possible causes for her falling Gait training during observation: reinforced heel strike and step length Educated on acquired scoliosis: to sit in front of mirror to observe and work on correcting and stretching to avoid progression   PATIENT EDUCATION:  Education details: See above Person educated: Patient Education method: Programmer, multimedia, Facilities manager, Verbal cues, and Handouts Education comprehension: verbalized understanding, returned demonstration, and verbal cues required  HOME EXERCISE PROGRAM: Access Code: [email or web address] URL: [email or web address] Date: 07/15/2024 Prepared by: Delon Haddock  Exercises - Standing Hamstring Stretch on Chair  - 1 x daily - 7 x weekly - 1 sets - 3 reps - 30 sec hold - Quadricep Stretch with Chair and Counter Support  - 1 x daily - 7 x weekly - 1 sets - 3 reps - 30 sec hold - Seated Figure 4 Piriformis Stretch  - 1 x daily - 7 x weekly - 1 sets - 3 reps - 30 sed hold - Seated Lateral Trunk Stretch on Swiss Ball  - 1 x daily - 7 x weekly - 1 sets - 10 reps - 5 sec  hold  ASSESSMENT:  CLINICAL IMPRESSION: Patient is a 63 y.o. female who was seen today for physical therapy evaluation and treatment for frequent falls.  She presents with various areas of weakness, tightness in hamstrings, hip flexors, hip rotation bilaterally, and slight gait deviations.  We will continue testing next visit for balance issues and begin working on fall prevention.  There was no indication of any major balance disorders or specific diagnosis today.  We will focus on hip stability and monitor for any neurological issues.  She  would benefit from skilled PT to focus on LE strengthening, LE flexibility, gait training, balance training, and fall prevention.    OBJECTIVE IMPAIRMENTS: Abnormal gait, decreased balance, difficulty walking, decreased strength, increased muscle spasms, impaired flexibility, postural dysfunction, and pain.   ACTIVITY LIMITATIONS: stairs, transfers, and bed mobility  PARTICIPATION LIMITATIONS: community activity and yard work  PERSONAL FACTORS: No significant other co-morbidities are also affecting patient's functional outcome.   REHAB POTENTIAL: Excellent  CLINICAL DECISION MAKING: Stable/uncomplicated  EVALUATION COMPLEXITY: Low   GOALS: Goals reviewed with patient? Yes  SHORT TERM GOALS: Target date: 08/13/2024  Patient will be independent with initial HEP  Baseline: Goal status: INITIAL  2.  Eliminate right low back pain Baseline:  Goal status: INITIAL   LONG TERM GOALS: Target date: 09/10/2024  Patient to be independent with advanced HEP  Baseline:  Goal status: INITIAL  2.  No falls during episode of skilled PT Baseline:  Goal status: INITIAL  3.  Eliminate trendelenburg gait Baseline:  Goal status: INITIAL  4.  Patient to be able to demonstrate SLS on each LE x 20 sec without LOB Baseline:  Goal status: INITIAL  5.  Patient to demonstrate 30 sec on each task of modified CTSIB with no LOB Baseline:  Goal status:  INITIAL    PLAN:  PT FREQUENCY: 1-2x/week  PT DURATION: 8 weeks  PLANNED INTERVENTIONS: 97110-Therapeutic exercises, 97530- Therapeutic activity, [id number]- Neuromuscular re-education, 97535- Self Care, [id number]- Manual therapy, [phone]- Gait training, [phone]- Canalith repositioning, [id number]- Aquatic Therapy, [phone]- Electrical stimulation (unattended), [id number]- Electrical stimulation (manual), [id number]- Vasopneumatic device, [id number]- Ultrasound, Patient/Family education, Balance training, Stair training, Joint mobilization, Spinal mobilization, Vestibular training, DME instructions, Cryotherapy, and Moist heat  PLAN FOR NEXT SESSION: Nustep, review HEP, gait training, LE strengthening.    Delon B. Ronnie Mallette, PT 07/16/24 8:08 AM [facility] [address] [place], [place] [address] Phone # [phone] Fax [phone]

## 2024-07-18 ENCOUNTER — Ambulatory Visit

## 2024-07-18 DIAGNOSIS — R2689 Other abnormalities of gait and mobility: Secondary | ICD-10-CM | POA: Diagnosis not present

## 2024-07-18 DIAGNOSIS — R252 Cramp and spasm: Secondary | ICD-10-CM | POA: Diagnosis not present

## 2024-07-18 DIAGNOSIS — R293 Abnormal posture: Secondary | ICD-10-CM

## 2024-07-18 DIAGNOSIS — R292 Abnormal reflex: Secondary | ICD-10-CM | POA: Diagnosis not present

## 2024-07-18 DIAGNOSIS — R262 Difficulty in walking, not elsewhere classified: Secondary | ICD-10-CM

## 2024-07-18 DIAGNOSIS — R2681 Unsteadiness on feet: Secondary | ICD-10-CM | POA: Diagnosis not present

## 2024-07-18 DIAGNOSIS — M6281 Muscle weakness (generalized): Secondary | ICD-10-CM | POA: Diagnosis not present

## 2024-07-18 DIAGNOSIS — R258 Other abnormal involuntary movements: Secondary | ICD-10-CM | POA: Diagnosis not present

## 2024-07-18 DIAGNOSIS — Z9181 History of falling: Secondary | ICD-10-CM

## 2024-07-18 NOTE — Therapy (Signed)
 OUTPATIENT PHYSICAL THERAPY LOWER EXTREMITY EVALUATION   Patient Name: Taylor Holloway MRN: [id number] DOB:1960/12/07, 63 y.o., female Today's Date: 07/18/2024  END OF SESSION:  PT End of Session - 07/18/24 1621     Visit Number 2    Date for PT Re-Evaluation 09/09/24    Authorization Type BCBS    Progress Note Due on Visit 10    PT Start Time 1618    PT Stop Time 1700    PT Time Calculation (min) 42 min    Activity Tolerance Patient tolerated treatment well    Behavior During Therapy WFL for tasks assessed/performed          Past Medical History:  Diagnosis Date   GERD (gastroesophageal reflux disease)    No pertinent past medical history    PONV (postoperative nausea and vomiting)    Past Surgical History:  Procedure Laterality Date   ABDOMINAL HYSTERECTOMY  11/13/2011   Procedure: HYSTERECTOMY ABDOMINAL;  Surgeon: Charlie CHRISTELLA Croak, MD;  Location: [facility];  Service: Gynecology;  Laterality: Left;  abdominal hysterectomy with left salpingo-ophoorectomy   ANTERIOR AND POSTERIOR REPAIR  11/13/2011   Procedure: ANTERIOR (CYSTOCELE) AND POSTERIOR REPAIR (RECTOCELE);  Surgeon: Glendia DELENA Elizabeth, MD;  Location: [facility];  Service: Urology;  Laterality: N/A;   DIAGNOSTIC LAPAROSCOPY  1990   LAPAROSCOPIC ASSISTED VAGINAL HYSTERECTOMY  11/13/2011   Procedure: LAPAROSCOPIC ASSISTED VAGINAL HYSTERECTOMY;  Surgeon: Charlie CHRISTELLA Croak, MD;  Location: [facility];  Service: Gynecology;  Laterality: N/A;  attempted   PUBOVAGINAL SLING  11/13/2011   Procedure: CARLOYN GLADE;  Surgeon: Glendia DELENA Elizabeth, MD;  Location: [facility];  Service: Urology;  Laterality: N/A;  sparc sling   Patient Active Problem List   Diagnosis Date Noted   Cough 11/09/2013   Pneumonia, organism unspecified(486) 11/09/2013   GERD 03/26/2009    PCP: Dr. Verena  REFERRING PROVIDER: Tobie Tonita POUR, DO  REFERRING DIAG: R29.2 (ICD-10-CM) - Hyperreflexia R26.81 (ICD-10-CM) - Unsteady gait R25.8 (ICD-10-CM) -  Bradykinesia  THERAPY DIAG:  Muscle weakness (generalized)  Cramp and spasm  Difficulty in walking, not elsewhere classified  History of falling  Abnormal posture  Rationale for Evaluation and Treatment: Rehabilitation  ONSET DATE: 07/11/2024  SUBJECTIVE:   SUBJECTIVE STATEMENT: Doing Ok, no falls.   Initial eval: I had 4 falls within 6 months, 2 within weeks of one another.  I went to my primary doctor and they have done multiple studies with no abnormal findings.  Sent her for neuro consult.  No significant findings.  I am retired,  I go workout 3 times per week, we go out and about for various things.  No family close by.  We do travel some.  We were recently on a trip and were walking down a sidewalk and I fell when I caught my foot on the seam in the sidewalk.  I would like to work on my balance and walking to avoid any further falls.    PERTINENT HISTORY: na PAIN:  07/18/24 Are you having pain? No  PRECAUTIONS: Fall  RED FLAGS: None   WEIGHT BEARING RESTRICTIONS: No  FALLS:  Has patient fallen in last 6 months? Yes. Number of falls 4  LIVING ENVIRONMENT: Lives with: lives with their spouse Lives in: House/apartment Stairs: Yes: Internal: 16 steps; on right going up and External: 5 steps; on right going up Has following equipment at home: None  OCCUPATION: Retired Engineer, civil (consulting)  PLOF: Independent, Independent with basic ADLs, Independent with household mobility without device,  Independent with community mobility without device, Independent with homemaking with ambulation, Independent with gait, and Independent with transfers  PATIENT GOALS: I would like to work on my balance and walking to avoid any further falls.    NEXT MD VISIT: prn  OBJECTIVE:  Note: Objective measures were completed at Evaluation unless otherwise noted.  DIAGNOSTIC FINDINGS: all diagnostics normal  COGNITION: Overall cognitive status: Within functional limits for tasks  assessed     SENSATION: WFL  MUSCLE LENGTH: Hamstrings: Right 55 deg; Left 60 deg Thomas test: Right pos; Left pos  POSTURE: No Significant postural limitations  PALPATION: Slight lumbar scoliosis noted: trunk shift to right  LOWER EXTREMITY ROM:  WNL  LOWER EXTREMITY MMT:  Generally 4+/5 with exception of hip abduction : right 4-/5, left 4/5,  hip ER right 3+/5, left 4-/5, hip extension 4-/5 bilaterally   FUNCTIONAL TESTS:  5 times sit to stand: 13.27 sec Timed up and go (TUG): 7.55 sec  GAIT: Distance walked: 100 feet Assistive device utilized: None Level of assistance: Complete Independence Comments: Mild trendelenburg but good step length and heel strike                                                                                                                                TREATMENT DATE:  07/18/24  Nustep x 5 min level 5 PT present to discuss status and progress Standing hamstring stretch x 30 seconds each LE Standing quad/hip flexor stretch x 30 seconds each LE Seated piriformis stretch x 30 sec each LE Cone touches 3 x 10 each LE with cone on  3D ball toss x 20 each direction on each LE Had patient stand on left LE in front of mirror to observe knee valgus happening and how to correct Hip matrix abduction 2 x 10 with 25 lbs  07/15/24  Initial eval completed and initiated HEP Educated patient on various possible causes for her falling Gait training during observation: reinforced heel strike and step length Educated on acquired scoliosis: to sit in front of mirror to observe and work on correcting and stretching to avoid progression   PATIENT EDUCATION:  Education details: See above Person educated: Patient Education method: Programmer, multimedia, Facilities manager, Verbal cues, and Handouts Education comprehension: verbalized understanding, returned demonstration, and verbal cues required  HOME EXERCISE PROGRAM: Access Code: [email or web address] URL:  [email or web address] Date: 07/15/2024 Prepared by: Delon Haddock  Exercises - Standing Hamstring Stretch on Chair  - 1 x daily - 7 x weekly - 1 sets - 3 reps - 30 sec hold - Quadricep Stretch with Chair and Counter Support  - 1 x daily - 7 x weekly - 1 sets - 3 reps - 30 sec hold - Seated Figure 4 Piriformis Stretch  - 1 x daily - 7 x weekly - 1 sets - 3 reps - 30 sed hold - Seated Lateral Trunk Stretch on Swiss Ball  - 1 x daily - 7 x weekly -  1 sets - 10 reps - 5 sec hold  ASSESSMENT:  CLINICAL IMPRESSION: Patient continues to function at a high level but had these falls that are unexplained.  We added higher level balance activities today along with isolated hip strengthening.  She does seem to have more pronounced left glut med weakness.  We did some balance tasks in front of mirror to allow visual feedback for correct alignment.  She is well motivated and compliant.  She should continue to do well.  She would benefit from skilled PT to focus on LE strengthening, LE flexibility, gait training, balance training, and fall prevention.    OBJECTIVE IMPAIRMENTS: Abnormal gait, decreased balance, difficulty walking, decreased strength, increased muscle spasms, impaired flexibility, postural dysfunction, and pain.   ACTIVITY LIMITATIONS: stairs, transfers, and bed mobility  PARTICIPATION LIMITATIONS: community activity and yard work  PERSONAL FACTORS: No significant other co-morbidities are also affecting patient's functional outcome.   REHAB POTENTIAL: Excellent  CLINICAL DECISION MAKING: Stable/uncomplicated  EVALUATION COMPLEXITY: Low   GOALS: Goals reviewed with patient? Yes  SHORT TERM GOALS: Target date: 08/13/2024  Patient will be independent with initial HEP  Baseline: Goal status: INITIAL  2.  Eliminate right low back pain Baseline:  Goal status: INITIAL   LONG TERM GOALS: Target date: 09/10/2024  Patient to be independent with advanced HEP   Baseline:  Goal status: INITIAL  2.  No falls during episode of skilled PT Baseline:  Goal status: INITIAL  3.  Eliminate trendelenburg gait Baseline:  Goal status: INITIAL  4.  Patient to be able to demonstrate SLS on each LE x 20 sec without LOB Baseline:  Goal status: INITIAL  5.  Patient to demonstrate 30 sec on each task of modified CTSIB with no LOB Baseline:  Goal status: INITIAL    PLAN:  PT FREQUENCY: 1-2x/week  PT DURATION: 8 weeks  PLANNED INTERVENTIONS: 97110-Therapeutic exercises, 97530- Therapeutic activity, [id number]- Neuromuscular re-education, 97535- Self Care, [id number]- Manual therapy, [phone]- Gait training, [phone]- Canalith repositioning, [id number]- Aquatic Therapy, [phone]- Electrical stimulation (unattended), [phone]- Electrical stimulation (manual), [id number]- Vasopneumatic device, [id number]- Ultrasound, Patient/Family education, Balance training, Stair training, Joint mobilization, Spinal mobilization, Vestibular training, DME instructions, Cryotherapy, and Moist heat  PLAN FOR NEXT SESSION: Nustep, progress HEP, focused left hip strengthening, gait training, LE strengthening.    Delon B. Yareni Creps, PT 07/18/24 5:15 PM [facility] [address] [place], [place] [address] Phone # [phone] Fax [phone]

## 2024-07-20 ENCOUNTER — Ambulatory Visit: Admitting: Neurology

## 2024-07-21 ENCOUNTER — Ambulatory Visit
Admission: RE | Admit: 2024-07-21 | Discharge: 2024-07-21 | Disposition: A | Discharge status: Home / Self Care | Source: Ambulatory Visit | Attending: Obstetrics and Gynecology | Admitting: Obstetrics and Gynecology

## 2024-07-21 DIAGNOSIS — Z1231 Encounter for screening mammogram for malignant neoplasm of breast: Secondary | ICD-10-CM

## 2024-07-21 NOTE — Therapy (Signed)
 OUTPATIENT PHYSICAL THERAPY LOWER EXTREMITY EVALUATION   Patient Name: Taylor Holloway MRN: [id number] DOB:July 03, 1961, 63 y.o., female Today's Date: 07/22/2024  END OF SESSION:  PT End of Session - 07/22/24 1009     Visit Number 3    Date for Recertification  09/09/24    Authorization Type BCBS    Progress Note Due on Visit 10    PT Start Time 1010    PT Stop Time 1100    PT Time Calculation (min) 50 min    Activity Tolerance Patient tolerated treatment well    Behavior During Therapy WFL for tasks assessed/performed           Past Medical History:  Diagnosis Date   GERD (gastroesophageal reflux disease)    No pertinent past medical history    PONV (postoperative nausea and vomiting)    Past Surgical History:  Procedure Laterality Date   ABDOMINAL HYSTERECTOMY  11/13/2011   Procedure: HYSTERECTOMY ABDOMINAL;  Surgeon: Charlie CHRISTELLA Croak, MD;  Location: [facility];  Service: Gynecology;  Laterality: Left;  abdominal hysterectomy with left salpingo-ophoorectomy   ANTERIOR AND POSTERIOR REPAIR  11/13/2011   Procedure: ANTERIOR (CYSTOCELE) AND POSTERIOR REPAIR (RECTOCELE);  Surgeon: Glendia DELENA Elizabeth, MD;  Location: [facility];  Service: Urology;  Laterality: N/A;   DIAGNOSTIC LAPAROSCOPY  1990   LAPAROSCOPIC ASSISTED VAGINAL HYSTERECTOMY  11/13/2011   Procedure: LAPAROSCOPIC ASSISTED VAGINAL HYSTERECTOMY;  Surgeon: Charlie CHRISTELLA Croak, MD;  Location: [facility];  Service: Gynecology;  Laterality: N/A;  attempted   PUBOVAGINAL SLING  11/13/2011   Procedure: CARLOYN GLADE;  Surgeon: Glendia DELENA Elizabeth, MD;  Location: [facility];  Service: Urology;  Laterality: N/A;  sparc sling   Patient Active Problem List   Diagnosis Date Noted   Cough 11/09/2013   Pneumonia, organism unspecified(486) 11/09/2013   GERD 03/26/2009    PCP: Dr. Verena  REFERRING PROVIDER: Tobie Tonita POUR, DO  REFERRING DIAG: R29.2 (ICD-10-CM) - Hyperreflexia R26.81 (ICD-10-CM) - Unsteady gait R25.8 (ICD-10-CM) -  Bradykinesia  THERAPY DIAG:  Muscle weakness (generalized)  Cramp and spasm  Difficulty in walking, not elsewhere classified  Rationale for Evaluation and Treatment: Rehabilitation  ONSET DATE: 07/11/2024  SUBJECTIVE:   SUBJECTIVE STATEMENT: Standing on my left leg is a struggle. I'm trying to take larger steps. The stretches are going well.   Initial eval: I had 4 falls within 6 months, 2 within weeks of one another.  I went to my primary doctor and they have done multiple studies with no abnormal findings.  Sent her for neuro consult.  No significant findings.  I am retired,  I go workout 3 times per week, we go out and about for various things.  No family close by.  We do travel some.  We were recently on a trip and were walking down a sidewalk and I fell when I caught my foot on the seam in the sidewalk.  I would like to work on my balance and walking to avoid any further falls.    PERTINENT HISTORY: na PAIN:  07/18/24 Are you having pain? No  PRECAUTIONS: Fall  RED FLAGS: None   WEIGHT BEARING RESTRICTIONS: No  FALLS:  Has patient fallen in last 6 months? Yes. Number of falls 4  LIVING ENVIRONMENT: Lives with: lives with their spouse Lives in: House/apartment Stairs: Yes: Internal: 16 steps; on right going up and External: 5 steps; on right going up Has following equipment at home: None  OCCUPATION: Retired Engineer, civil (consulting)  PLOF: Independent, Independent  with basic ADLs, Independent with household mobility without device, Independent with community mobility without device, Independent with homemaking with ambulation, Independent with gait, and Independent with transfers  PATIENT GOALS: I would like to work on my balance and walking to avoid any further falls.    NEXT MD VISIT: prn  OBJECTIVE:  Note: Objective measures were completed at Evaluation unless otherwise noted.  DIAGNOSTIC FINDINGS: all diagnostics normal  COGNITION: Overall cognitive status: Within  functional limits for tasks assessed     SENSATION: WFL  MUSCLE LENGTH: Hamstrings: Right 55 deg; Left 60 deg Thomas test: Right pos; Left pos  POSTURE: No Significant postural limitations  PALPATION: Slight lumbar scoliosis noted: trunk shift to right  LOWER EXTREMITY ROM:  WNL  LOWER EXTREMITY MMT:  Generally 4+/5 with exception of hip abduction : right 4-/5, left 4/5,  hip ER right 3+/5, left 4-/5, hip extension 4-/5 bilaterally   FUNCTIONAL TESTS:  5 times sit to stand: 13.27 sec Timed up and go (TUG): 7.55 sec  GAIT: Distance walked: 100 feet Assistive device utilized: None Level of assistance: Complete Independence Comments: Mild trendelenburg but good step length and heel strike                                                                                                                                TREATMENT DATE:  07/22/24  Nustep x 5 min level 5 PT present to discuss status and progress Standing hamstring stretch 2 x 30 seconds each LE Standing quad/hip flexor stretch 2 x 30 seconds each LE Hip hike on bottom stairs x 30 B Single leg Hip ABD into ball 5 sec hold x 10 B Functional squat to chair x 10 working on form, then to chair + foam x 10 with PT blocking patients knees Wall squats x 10 Cone touches 2 x 10 each LE with cone on mat (L side using R toes as kickstand) SL clam black loop x 20 B Hip matrix abduction 2 x 10 with 25 lbs   07/18/24  Nustep x 5 min level 5 PT present to discuss status and progress Standing hamstring stretch x 30 seconds each LE Standing quad/hip flexor stretch x 30 seconds each LE Seated piriformis stretch x 30 sec each LE Cone touches 3 x 10 each LE with cone on  3D ball toss x 20 each direction on each LE Had patient stand on left LE in front of mirror to observe knee valgus happening and how to correct Hip matrix abduction 2 x 10 with 25 lbs  07/15/24  Initial eval completed and initiated HEP Educated patient on  various possible causes for her falling Gait training during observation: reinforced heel strike and step length Educated on acquired scoliosis: to sit in front of mirror to observe and work on correcting and stretching to avoid progression   PATIENT EDUCATION:  Education details: See above Person educated: Patient Education method: Programmer, multimedia, Facilities manager, Verbal  cues, and Handouts Education comprehension: verbalized understanding, returned demonstration, and verbal cues required  HOME EXERCISE PROGRAM: Access Code: [email or web address] URL: [email or web address] Date: 07/22/2024 Prepared by: Mliss  Exercises - Standing Hamstring Stretch on Chair  - 1 x daily - 7 x weekly - 1 sets - 3 reps - 30 sec hold - Theatre manager with Chair and Counter Support  - 1 x daily - 7 x weekly - 1 sets - 3 reps - 30 sec hold - Seated Figure 4 Piriformis Stretch  - 1 x daily - 7 x weekly - 1 sets - 3 reps - 30 sed hold - Seated Lateral Trunk Stretch on Swiss Ball  - 1 x daily - 7 x weekly - 1 sets - 10 reps - 5 sec hold - Clamshell with Resistance  - 1 x daily - 3 x weekly - 2-3 sets - 10 reps - Standing Isometric Hip Abduction with Ball on Wall  - 1 x daily - 3 x weekly - 2 sets - 10 reps - 5 sec hold - Hip Hiking on Step  - 1 x daily - 3 x weekly - 2 sets - 10 reps - Wall Squat  - 1 x daily - 3 x weekly - 2 sets - 10 reps  ASSESSMENT:  CLINICAL IMPRESSION: Patient did well with glute med strengthening today. Still demonstrating valgus with SLS and required kickstand when doing L side today. HEP updated to address these weaknesses. She was also interested in squats so we began these today too.  OBJECTIVE IMPAIRMENTS: Abnormal gait, decreased balance, difficulty walking, decreased strength, increased muscle spasms, impaired flexibility, postural dysfunction, and pain.   ACTIVITY LIMITATIONS: stairs, transfers, and bed mobility  PARTICIPATION LIMITATIONS: community activity and yard  work  PERSONAL FACTORS: No significant other co-morbidities are also affecting patient's functional outcome.   REHAB POTENTIAL: Excellent  CLINICAL DECISION MAKING: Stable/uncomplicated  EVALUATION COMPLEXITY: Low   GOALS: Goals reviewed with patient? Yes  SHORT TERM GOALS: Target date: 08/13/2024  Patient will be independent with initial HEP  Baseline: Goal status: INITIAL  2.  Eliminate right low back pain Baseline:  Goal status: INITIAL   LONG TERM GOALS: Target date: 09/10/2024  Patient to be independent with advanced HEP  Baseline:  Goal status: INITIAL  2.  No falls during episode of skilled PT Baseline:  Goal status: INITIAL  3.  Eliminate trendelenburg gait Baseline:  Goal status: INITIAL  4.  Patient to be able to demonstrate SLS on each LE x 20 sec without LOB Baseline:  Goal status: INITIAL  5.  Patient to demonstrate 30 sec on each task of modified CTSIB with no LOB Baseline:  Goal status: INITIAL    PLAN:  PT FREQUENCY: 1-2x/week  PT DURATION: 8 weeks  PLANNED INTERVENTIONS: 97110-Therapeutic exercises, 97530- Therapeutic activity, [id number]- Neuromuscular re-education, 97535- Self Care, 02859- Manual therapy, [phone]- Gait training, [phone]- Canalith repositioning, [id number]- Aquatic Therapy, [phone]- Electrical stimulation (unattended), [phone]- Electrical stimulation (manual), 97016- Vasopneumatic device, [id number]- Ultrasound, Patient/Family education, Balance training, Stair training, Joint mobilization, Spinal mobilization, Vestibular training, DME instructions, Cryotherapy, and Moist heat  PLAN FOR NEXT SESSION: Nustep, progress HEP, focused left hip strengthening, gait training, LE strengthening.    Mliss Cummins, PT  07/22/24 11:02 AM [facility] [address] [place], [place] [address] Phone # [phone] Fax [phone]

## 2024-07-22 ENCOUNTER — Ambulatory Visit
Admission: RE | Admit: 2024-07-22 | Discharge: 2024-07-22 | Disposition: A | Discharge status: Home / Self Care | Source: Ambulatory Visit | Attending: Neurology | Admitting: Neurology

## 2024-07-22 ENCOUNTER — Encounter: Payer: Self-pay | Admitting: Physical Therapy

## 2024-07-22 ENCOUNTER — Ambulatory Visit: Admitting: Physical Therapy

## 2024-07-22 DIAGNOSIS — R252 Cramp and spasm: Secondary | ICD-10-CM | POA: Diagnosis not present

## 2024-07-22 DIAGNOSIS — R262 Difficulty in walking, not elsewhere classified: Secondary | ICD-10-CM

## 2024-07-22 DIAGNOSIS — R2681 Unsteadiness on feet: Secondary | ICD-10-CM

## 2024-07-22 DIAGNOSIS — R292 Abnormal reflex: Secondary | ICD-10-CM

## 2024-07-22 DIAGNOSIS — R2689 Other abnormalities of gait and mobility: Secondary | ICD-10-CM | POA: Diagnosis not present

## 2024-07-22 DIAGNOSIS — R258 Other abnormal involuntary movements: Secondary | ICD-10-CM

## 2024-07-22 DIAGNOSIS — M6281 Muscle weakness (generalized): Secondary | ICD-10-CM | POA: Diagnosis not present

## 2024-07-22 DIAGNOSIS — Z9181 History of falling: Secondary | ICD-10-CM | POA: Diagnosis not present

## 2024-07-22 DIAGNOSIS — M47817 Spondylosis without myelopathy or radiculopathy, lumbosacral region: Secondary | ICD-10-CM | POA: Diagnosis not present

## 2024-07-22 DIAGNOSIS — R293 Abnormal posture: Secondary | ICD-10-CM | POA: Diagnosis not present

## 2024-07-22 DIAGNOSIS — M5126 Other intervertebral disc displacement, lumbar region: Secondary | ICD-10-CM | POA: Diagnosis not present

## 2024-07-25 ENCOUNTER — Encounter: Payer: Self-pay | Admitting: Physical Therapy

## 2024-07-25 ENCOUNTER — Ambulatory Visit: Admitting: Physical Therapy

## 2024-07-25 DIAGNOSIS — R293 Abnormal posture: Secondary | ICD-10-CM | POA: Diagnosis not present

## 2024-07-25 DIAGNOSIS — R2689 Other abnormalities of gait and mobility: Secondary | ICD-10-CM | POA: Diagnosis not present

## 2024-07-25 DIAGNOSIS — M6281 Muscle weakness (generalized): Secondary | ICD-10-CM

## 2024-07-25 DIAGNOSIS — R292 Abnormal reflex: Secondary | ICD-10-CM | POA: Diagnosis not present

## 2024-07-25 DIAGNOSIS — R262 Difficulty in walking, not elsewhere classified: Secondary | ICD-10-CM

## 2024-07-25 DIAGNOSIS — R2681 Unsteadiness on feet: Secondary | ICD-10-CM | POA: Diagnosis not present

## 2024-07-25 DIAGNOSIS — R252 Cramp and spasm: Secondary | ICD-10-CM | POA: Diagnosis not present

## 2024-07-25 DIAGNOSIS — Z9181 History of falling: Secondary | ICD-10-CM | POA: Diagnosis not present

## 2024-07-25 DIAGNOSIS — R258 Other abnormal involuntary movements: Secondary | ICD-10-CM | POA: Diagnosis not present

## 2024-07-25 NOTE — Therapy (Signed)
 OUTPATIENT PHYSICAL THERAPY LOWER EXTREMITY TREATMENT   Patient Name: Taylor Holloway MRN: [id number] DOB:Oct 19, 1961, 63 y.o., female Today's Date: 07/25/2024  END OF SESSION:  PT End of Session - 07/25/24 1703     Visit Number 4    Date for Recertification  09/09/24    Authorization Type BCBS    Progress Note Due on Visit 10    PT Start Time 1610    PT Stop Time 1655    PT Time Calculation (min) 45 min    Activity Tolerance Patient tolerated treatment well    Behavior During Therapy WFL for tasks assessed/performed            Past Medical History:  Diagnosis Date   GERD (gastroesophageal reflux disease)    No pertinent past medical history    PONV (postoperative nausea and vomiting)    Past Surgical History:  Procedure Laterality Date   ABDOMINAL HYSTERECTOMY  11/13/2011   Procedure: HYSTERECTOMY ABDOMINAL;  Surgeon: Charlie CHRISTELLA Croak, MD;  Location: [facility];  Service: Gynecology;  Laterality: Left;  abdominal hysterectomy with left salpingo-ophoorectomy   ANTERIOR AND POSTERIOR REPAIR  11/13/2011   Procedure: ANTERIOR (CYSTOCELE) AND POSTERIOR REPAIR (RECTOCELE);  Surgeon: Glendia DELENA Elizabeth, MD;  Location: [facility];  Service: Urology;  Laterality: N/A;   DIAGNOSTIC LAPAROSCOPY  1990   LAPAROSCOPIC ASSISTED VAGINAL HYSTERECTOMY  11/13/2011   Procedure: LAPAROSCOPIC ASSISTED VAGINAL HYSTERECTOMY;  Surgeon: Charlie CHRISTELLA Croak, MD;  Location: [facility];  Service: Gynecology;  Laterality: N/A;  attempted   PUBOVAGINAL SLING  11/13/2011   Procedure: CARLOYN GLADE;  Surgeon: Glendia DELENA Elizabeth, MD;  Location: [facility];  Service: Urology;  Laterality: N/A;  sparc sling   Patient Active Problem List   Diagnosis Date Noted   Cough 11/09/2013   Pneumonia, organism unspecified(486) 11/09/2013   GERD 03/26/2009    PCP: Dr. Verena  REFERRING PROVIDER: Tobie Tonita POUR, DO  REFERRING DIAG: R29.2 (ICD-10-CM) - Hyperreflexia R26.81 (ICD-10-CM) - Unsteady gait R25.8 (ICD-10-CM) -  Bradykinesia  THERAPY DIAG:  Muscle weakness (generalized)  Cramp and spasm  Difficulty in walking, not elsewhere classified  Rationale for Evaluation and Treatment: Rehabilitation  ONSET DATE: 07/11/2024  SUBJECTIVE:   SUBJECTIVE STATEMENT: Patient reports she is okay today. She feels tired after treatment sessions but not soreness.  Initial eval: I had 4 falls within 6 months, 2 within weeks of one another.  I went to my primary doctor and they have done multiple studies with no abnormal findings.  Sent her for neuro consult.  No significant findings.  I am retired,  I go workout 3 times per week, we go out and about for various things.  No family close by.  We do travel some.  We were recently on a trip and were walking down a sidewalk and I fell when I caught my foot on the seam in the sidewalk.  I would like to work on my balance and walking to avoid any further falls.    PERTINENT HISTORY: na PAIN:  07/18/24 Are you having pain? No  PRECAUTIONS: Fall  RED FLAGS: None   WEIGHT BEARING RESTRICTIONS: No  FALLS:  Has patient fallen in last 6 months? Yes. Number of falls 4  LIVING ENVIRONMENT: Lives with: lives with their spouse Lives in: House/apartment Stairs: Yes: Internal: 16 steps; on right going up and External: 5 steps; on right going up Has following equipment at home: None  OCCUPATION: Retired Engineer, civil (consulting)  PLOF: Independent, Independent with basic ADLs, Independent  with household mobility without device, Independent with community mobility without device, Independent with homemaking with ambulation, Independent with gait, and Independent with transfers  PATIENT GOALS: I would like to work on my balance and walking to avoid any further falls.    NEXT MD VISIT: prn  OBJECTIVE:  Note: Objective measures were completed at Evaluation unless otherwise noted.  DIAGNOSTIC FINDINGS: all diagnostics normal  COGNITION: Overall cognitive status: Within functional  limits for tasks assessed     SENSATION: WFL  MUSCLE LENGTH: Hamstrings: Right 55 deg; Left 60 deg Thomas test: Right pos; Left pos  POSTURE: No Significant postural limitations  PALPATION: Slight lumbar scoliosis noted: trunk shift to right  LOWER EXTREMITY ROM:  WNL  LOWER EXTREMITY MMT:  Generally 4+/5 with exception of hip abduction : right 4-/5, left 4/5,  hip ER right 3+/5, left 4-/5, hip extension 4-/5 bilaterally   FUNCTIONAL TESTS:  5 times sit to stand: 13.27 sec Timed up and go (TUG): 7.55 sec  GAIT: Distance walked: 100 feet Assistive device utilized: None Level of assistance: Complete Independence Comments: Mild trendelenburg but good step length and heel strike                                                                                                                                TREATMENT DATE:  07/25/24  Nustep x 5 min level 7 PT present to discuss status and progress Single leg Hip ABD into purple ball 5 sec hold x 10 B Hip hike on bottom stairs x 30 bilateral Single leg on therapad + opposite leg swing (forwards, lateral, backwards) x 10 bilateral (trying not to hold on but standing next to barre for support as needed) Discussion of possible causes of left leg weakness 6inch step ups with 10# unilateral KB hold x 12 bilateral  Hip matrix abduction (30#) & extension (40#) 2 x 10 Farmer's carry holding two 10# DB x 1 lap around both gyms   07/22/24  Nustep x 5 min level 5 PT present to discuss status and progress Standing hamstring stretch 2 x 30 seconds each LE Standing quad/hip flexor stretch 2 x 30 seconds each LE Hip hike on bottom stairs x 30 B Single leg Hip ABD into ball 5 sec hold x 10 B Functional squat to chair x 10 working on form, then to chair + foam x 10 with PT blocking patients knees Wall squats x 10 Cone touches 2 x 10 each LE with cone on mat (L side using R toes as kickstand) SL clam black loop x 20 B Hip matrix abduction  2 x 10 with 25 lbs   07/18/24  Nustep x 5 min level 5 PT present to discuss status and progress Standing hamstring stretch x 30 seconds each LE Standing quad/hip flexor stretch x 30 seconds each LE Seated piriformis stretch x 30 sec each LE Cone touches 3 x 10 each LE with cone on  3D ball toss x 20 each direction on each LE Had patient stand on left LE in front of mirror to observe knee valgus happening and how to correct Hip matrix abduction 2 x 10 with 25 lbs  07/15/24  Initial eval completed and initiated HEP Educated patient on various possible causes for her falling Gait training during observation: reinforced heel strike and step length Educated on acquired scoliosis: to sit in front of mirror to observe and work on correcting and stretching to avoid progression   PATIENT EDUCATION:  Education details: See above Person educated: Patient Education method: Programmer, multimedia, Facilities manager, Verbal cues, and Handouts Education comprehension: verbalized understanding, returned demonstration, and verbal cues required  HOME EXERCISE PROGRAM: Access Code: [email or web address] URL: [email or web address] Date: 07/22/2024 Prepared by: Mliss  Exercises - Standing Hamstring Stretch on Chair  - 1 x daily - 7 x weekly - 1 sets - 3 reps - 30 sec hold - Theatre manager with Chair and Counter Support  - 1 x daily - 7 x weekly - 1 sets - 3 reps - 30 sec hold - Seated Figure 4 Piriformis Stretch  - 1 x daily - 7 x weekly - 1 sets - 3 reps - 30 sed hold - Seated Lateral Trunk Stretch on Swiss Ball  - 1 x daily - 7 x weekly - 1 sets - 10 reps - 5 sec hold - Clamshell with Resistance  - 1 x daily - 3 x weekly - 2-3 sets - 10 reps - Standing Isometric Hip Abduction with Ball on Wall  - 1 x daily - 3 x weekly - 2 sets - 10 reps - 5 sec hold - Hip Hiking on Step  - 1 x daily - 3 x weekly - 2 sets - 10 reps - Wall Squat  - 1 x daily - 3 x weekly - 2 sets - 10 reps  ASSESSMENT:  CLINICAL  IMPRESSION: Treatment session focused on glute strengthening and single leg balance. Patient got a MRI of lumbar spine on Friday, but results have not been read yet. Patient was challenged with single leg balance activities on a unstable surface. Discussed potential causes of left left weakness and patient catching her foot when walking. Overall, patient tolerated exercise intensity well. PT monitored patient during treatment session and provided verbal and visual cues as needed. Patient will benefit from skilled PT to address the below impairments and improve overall function.   OBJECTIVE IMPAIRMENTS: Abnormal gait, decreased balance, difficulty walking, decreased strength, increased muscle spasms, impaired flexibility, postural dysfunction, and pain.   ACTIVITY LIMITATIONS: stairs, transfers, and bed mobility  PARTICIPATION LIMITATIONS: community activity and yard work  PERSONAL FACTORS: No significant other co-morbidities are also affecting patient's functional outcome.   REHAB POTENTIAL: Excellent  CLINICAL DECISION MAKING: Stable/uncomplicated  EVALUATION COMPLEXITY: Low   GOALS: Goals reviewed with patient? Yes  SHORT TERM GOALS: Target date: 08/13/2024  Patient will be independent with initial HEP  Baseline: Goal status: INITIAL  2.  Eliminate right low back pain Baseline:  Goal status: INITIAL   LONG TERM GOALS: Target date: 09/10/2024  Patient to be independent with advanced HEP  Baseline:  Goal status: INITIAL  2.  No falls during episode of skilled PT Baseline:  Goal status: INITIAL  3.  Eliminate trendelenburg gait Baseline:  Goal status: INITIAL  4.  Patient to be able to demonstrate SLS on each LE x 20 sec without LOB Baseline:  Goal status: INITIAL  5.  Patient to demonstrate 30 sec  on each task of modified CTSIB with no LOB Baseline:  Goal status: INITIAL    PLAN:  PT FREQUENCY: 1-2x/week  PT DURATION: 8 weeks  PLANNED INTERVENTIONS:  97110-Therapeutic exercises, 97530- Therapeutic activity, 97112- Neuromuscular re-education, [phone]- Self Care, 02859- Manual therapy, [phone]- Gait training, [phone]- Canalith repositioning, [id number]- Aquatic Therapy, [phone]- Electrical stimulation (unattended), [phone]- Electrical stimulation (manual), 97016- Vasopneumatic device, [id number]- Ultrasound, Patient/Family education, Balance training, Stair training, Joint mobilization, Spinal mobilization, Vestibular training, DME instructions, Cryotherapy, and Moist heat  PLAN FOR NEXT SESSION: check to see if imaging has been read; hip strengthening; toe clearance ; single leg balance   Kristeen Sar, PT 07/25/24 5:04 PM [facility] [address] [place], [place] [address] Phone # [phone] Fax [phone]

## 2024-07-27 ENCOUNTER — Ambulatory Visit: Payer: Self-pay | Admitting: Neurology

## 2024-07-29 ENCOUNTER — Ambulatory Visit

## 2024-07-29 DIAGNOSIS — M6281 Muscle weakness (generalized): Secondary | ICD-10-CM

## 2024-07-29 DIAGNOSIS — R292 Abnormal reflex: Secondary | ICD-10-CM | POA: Diagnosis not present

## 2024-07-29 DIAGNOSIS — R293 Abnormal posture: Secondary | ICD-10-CM | POA: Diagnosis not present

## 2024-07-29 DIAGNOSIS — R2689 Other abnormalities of gait and mobility: Secondary | ICD-10-CM | POA: Diagnosis not present

## 2024-07-29 DIAGNOSIS — R252 Cramp and spasm: Secondary | ICD-10-CM | POA: Diagnosis not present

## 2024-07-29 DIAGNOSIS — R2681 Unsteadiness on feet: Secondary | ICD-10-CM | POA: Diagnosis not present

## 2024-07-29 DIAGNOSIS — R258 Other abnormal involuntary movements: Secondary | ICD-10-CM | POA: Diagnosis not present

## 2024-07-29 DIAGNOSIS — R262 Difficulty in walking, not elsewhere classified: Secondary | ICD-10-CM

## 2024-07-29 DIAGNOSIS — Z9181 History of falling: Secondary | ICD-10-CM | POA: Diagnosis not present

## 2024-07-29 NOTE — Therapy (Signed)
 OUTPATIENT PHYSICAL THERAPY LOWER EXTREMITY TREATMENT   Patient Name: Taylor Holloway MRN: [id number] DOB:04/17/1961, 63 y.o., female Today's Date: 07/29/2024  END OF SESSION:  PT End of Session - 07/29/24 0901     Visit Number 5    Date for Recertification  09/09/24    Authorization Type BCBS    Progress Note Due on Visit 10    PT Start Time 0901    PT Stop Time 0959    PT Time Calculation (min) 58 min    Activity Tolerance Patient tolerated treatment well    Behavior During Therapy WFL for tasks assessed/performed            Past Medical History:  Diagnosis Date   GERD (gastroesophageal reflux disease)    No pertinent past medical history    PONV (postoperative nausea and vomiting)    Past Surgical History:  Procedure Laterality Date   ABDOMINAL HYSTERECTOMY  11/13/2011   Procedure: HYSTERECTOMY ABDOMINAL;  Surgeon: Charlie CHRISTELLA Croak, MD;  Location: [facility];  Service: Gynecology;  Laterality: Left;  abdominal hysterectomy with left salpingo-ophoorectomy   ANTERIOR AND POSTERIOR REPAIR  11/13/2011   Procedure: ANTERIOR (CYSTOCELE) AND POSTERIOR REPAIR (RECTOCELE);  Surgeon: Glendia DELENA Elizabeth, MD;  Location: [facility];  Service: Urology;  Laterality: N/A;   DIAGNOSTIC LAPAROSCOPY  1990   LAPAROSCOPIC ASSISTED VAGINAL HYSTERECTOMY  11/13/2011   Procedure: LAPAROSCOPIC ASSISTED VAGINAL HYSTERECTOMY;  Surgeon: Charlie CHRISTELLA Croak, MD;  Location: [facility];  Service: Gynecology;  Laterality: N/A;  attempted   PUBOVAGINAL SLING  11/13/2011   Procedure: CARLOYN GLADE;  Surgeon: Glendia DELENA Elizabeth, MD;  Location: [facility];  Service: Urology;  Laterality: N/A;  sparc sling   Patient Active Problem List   Diagnosis Date Noted   Cough 11/09/2013   Pneumonia, organism unspecified(486) 11/09/2013   GERD 03/26/2009    PCP: Dr. Verena  REFERRING PROVIDER: Tobie Tonita POUR, DO  REFERRING DIAG: R29.2 (ICD-10-CM) - Hyperreflexia R26.81 (ICD-10-CM) - Unsteady gait R25.8 (ICD-10-CM) -  Bradykinesia  THERAPY DIAG:  Muscle weakness (generalized)  Cramp and spasm  Difficulty in walking, not elsewhere classified  History of falling  Rationale for Evaluation and Treatment: Rehabilitation  ONSET DATE: 07/11/2024  SUBJECTIVE:   SUBJECTIVE STATEMENT: Patient reports she is okay today. She feels like her gluteals are getting stronger and the balance is getting better. Back MRI was negative. Have not had a fall since the 2nd week of July, but I am also trying to only do 1 thing at a time. For instance, don't walk up steps and look at your phone.  Initial eval: I had 4 falls within 6 months, 2 within weeks of one another.  I went to my primary doctor and they have done multiple studies with no abnormal findings.  Sent her for neuro consult.  No significant findings.  I am retired,  I go workout 3 times per week, we go out and about for various things.  No family close by.  We do travel some.  We were recently on a trip and were walking down a sidewalk and I fell when I caught my foot on the seam in the sidewalk.  I would like to work on my balance and walking to avoid any further falls.    PERTINENT HISTORY: na PAIN:  07/18/24 Are you having pain? No  PRECAUTIONS: Fall  RED FLAGS: None   WEIGHT BEARING RESTRICTIONS: No  FALLS:  Has patient fallen in last 6 months? Yes. Number of falls  4  LIVING ENVIRONMENT: Lives with: lives with their spouse Lives in: House/apartment Stairs: Yes: Internal: 16 steps; on right going up and External: 5 steps; on right going up Has following equipment at home: None  OCCUPATION: Retired Engineer, civil (consulting)  PLOF: Independent, Independent with basic ADLs, Independent with household mobility without device, Independent with community mobility without device, Independent with homemaking with ambulation, Independent with gait, and Independent with transfers  PATIENT GOALS: I would like to work on my balance and walking to avoid any further falls.     NEXT MD VISIT: prn  OBJECTIVE:  Note: Objective measures were completed at Evaluation unless otherwise noted.  DIAGNOSTIC FINDINGS: all diagnostics normal  COGNITION: Overall cognitive status: Within functional limits for tasks assessed     SENSATION: WFL  MUSCLE LENGTH: Hamstrings: Right 55 deg; Left 60 deg Thomas test: Right pos; Left pos  POSTURE: No Significant postural limitations  PALPATION: Slight lumbar scoliosis noted: trunk shift to right  LOWER EXTREMITY ROM:  WNL  LOWER EXTREMITY MMT:  Generally 4+/5 with exception of hip abduction : right 4-/5, left 4/5,  hip ER right 3+/5, left 4-/5, hip extension 4-/5 bilaterally   FUNCTIONAL TESTS:  5 times sit to stand: 13.27 sec Timed up and go (TUG): 7.55 sec  GAIT: Distance walked: 100 feet Assistive device utilized: None Level of assistance: Complete Independence Comments: Mild trendelenburg but good step length and heel strike                                                                                                                                TREATMENT DATE:   07/29/2024 Nu Step seat 6, UE 9, Lev 6 x 7 min,560 steps 4 lengths 10 steps lateral band walks with red,  Monster walks 4 lengths 3 D standing hip red band x 10 B, flex, abd, ext holding bars Tandem stance x 1 B to failure, SLS x 1 B Hip Bilateral IR with ball squeeze/ER each leg separately with red band x 10 ea seated on high table Cone touches on chair x 2 to failure SLS with alternate UE raises x 5 Airex beam x 6 lengths forward, 6 lengths sideways. FABER stretch 3 x 30 sec B Updated HEP with Medbridge pictures    07/25/24  Nustep x 5 min level 7 PT present to discuss status and progress Single leg Hip ABD into purple ball 5 sec hold x 10 B Hip hike on bottom stairs x 30 bilateral Single leg on therapad + opposite leg swing (forwards, lateral, backwards) x 10 bilateral (trying not to hold on but standing next to barre for support  as needed) Discussion of possible causes of left leg weakness 6inch step ups with 10# unilateral KB hold x 12 bilateral  Hip matrix abduction (30#) & extension (40#) 2 x 10 Farmer's carry holding two 10# DB x 1 lap around both gyms   07/22/24  Nustep x 5 min level  5 PT present to discuss status and progress Standing hamstring stretch 2 x 30 seconds each LE Standing quad/hip flexor stretch 2 x 30 seconds each LE Hip hike on bottom stairs x 30 B Single leg Hip ABD into ball 5 sec hold x 10 B Functional squat to chair x 10 working on form, then to chair + foam x 10 with PT blocking patients knees Wall squats x 10 Cone touches 2 x 10 each LE with cone on mat (L side using R toes as kickstand) SL clam black loop x 20 B Hip matrix abduction 2 x 10 with 25 lbs   07/18/24  Nustep x 5 min level 5 PT present to discuss status and progress Standing hamstring stretch x 30 seconds each LE Standing quad/hip flexor stretch x 30 seconds each LE Seated piriformis stretch x 30 sec each LE Cone touches 3 x 10 each LE with cone on  3D ball toss x 20 each direction on each LE Had patient stand on left LE in front of mirror to observe knee valgus happening and how to correct Hip matrix abduction 2 x 10 with 25 lbs  07/15/24  Initial eval completed and initiated HEP Educated patient on various possible causes for her falling Gait training during observation: reinforced heel strike and step length Educated on acquired scoliosis: to sit in front of mirror to observe and work on correcting and stretching to avoid progression   PATIENT EDUCATION:  Education details: See above Person educated: Patient Education method: Programmer, multimedia, Facilities manager, Verbal cues, and Handouts Education comprehension: verbalized understanding, returned demonstration, and verbal cues required  HOME EXERCISE PROGRAM: Access Code: [email or web address] URL: [email or web address] Date: 07/29/2024 Prepared by: Grayce Sheldon  Exercises - Side Stepping with Resistance at Feet  - 1 x daily - 3 x weekly - 3 sets - 10 reps - Forward Monster Walks  - 1 x daily - 3 x weekly - 3 sets - 10 reps - Seated Hip External Rotation with Resistance  - 1 x daily - 7 x weekly - 1-2 sets - 10 reps - Standing Hip Extension with Resistance at Ankles and Counter Support  - 1 x daily - 7 x weekly - 1-2 sets - 10 reps - Standing Hip Flexion with Anchored Resistance and Chair Support  - 1 x daily - 7 x weekly - 1-2 sets - 10 reps - Standing Hip Abduction Kicks  - 1 x daily - 7 x weekly - 1-2 sets - 10 reps - Supine Hip External Rotation Stretch  - 1 x daily - 7 x weekly - 1 sets - 3 reps - 20-30 hold Access Code: [email or web address] URL: [email or web address] Date: 07/22/2024 Prepared by: Mliss  Exercises - Standing Hamstring Stretch on Chair  - 1 x daily - 7 x weekly - 1 sets - 3 reps - 30 sec hold - Quadricep Stretch with Chair and Counter Support  - 1 x daily - 7 x weekly - 1 sets - 3 reps - 30 sec hold - Seated Figure 4 Piriformis Stretch  - 1 x daily - 7 x weekly - 1 sets - 3 reps - 30 sed hold - Seated Lateral Trunk Stretch on Swiss Ball  - 1 x daily - 7 x weekly - 1 sets - 10 reps - 5 sec hold - Clamshell with Resistance  - 1 x daily - 3 x weekly - 2-3 sets - 10 reps - Standing Isometric Hip Abduction with Ball on Wall  - 1 x  daily - 3 x weekly - 2 sets - 10 reps - 5 sec hold - Hip Hiking on Step  - 1 x daily - 3 x weekly - 2 sets - 10 reps - Wall Squat  - 1 x daily - 3 x weekly - 2 sets - 10 reps  ASSESSMENT:  CLINICAL IMPRESSION: Treatment session focused on glute strengthening and single leg balance. Patient got a MRI of lumbar spine on Friday, but results have not been read yet. Patient was challenged with single leg balance activities on a unstable surface. Discussed potential causes of left left weakness and patient catching her foot when walking. Overall, patient tolerated exercise intensity well. PT  monitored patient during treatment session and provided verbal and visual cues as needed. Patient will benefit from skilled PT to address the below impairments and improve overall function.   OBJECTIVE IMPAIRMENTS: Abnormal gait, decreased balance, difficulty walking, decreased strength, increased muscle spasms, impaired flexibility, postural dysfunction, and pain.   ACTIVITY LIMITATIONS: stairs, transfers, and bed mobility  PARTICIPATION LIMITATIONS: community activity and yard work  PERSONAL FACTORS: No significant other co-morbidities are also affecting patient's functional outcome.   REHAB POTENTIAL: Excellent  CLINICAL DECISION MAKING: Stable/uncomplicated  EVALUATION COMPLEXITY: Low   GOALS: Goals reviewed with patient? Yes  SHORT TERM GOALS: Target date: 08/13/2024  Patient will be independent with initial HEP  Baseline: Goal status: INITIAL  2.  Eliminate right low back pain Baseline:  Goal status: INITIAL   LONG TERM GOALS: Target date: 09/10/2024  Patient to be independent with advanced HEP  Baseline:  Goal status: INITIAL  2.  No falls during episode of skilled PT Baseline:  Goal status: INITIAL  3.  Eliminate trendelenburg gait Baseline:  Goal status: INITIAL  4.  Patient to be able to demonstrate SLS on each LE x 20 sec without LOB Baseline:  Goal status: INITIAL  5.  Patient to demonstrate 30 sec on each task of modified CTSIB with no LOB Baseline:  Goal status: INITIAL    PLAN:  PT FREQUENCY: 1-2x/week  PT DURATION: 8 weeks  PLANNED INTERVENTIONS: 97110-Therapeutic exercises, 97530- Therapeutic activity, 97112- Neuromuscular re-education, [phone]- Self Care, 02859- Manual therapy, [phone]- Gait training, [phone]- Canalith repositioning, [id number]- Aquatic Therapy, [phone]- Electrical stimulation (unattended), [phone]- Electrical stimulation (manual), 97016- Vasopneumatic device, [id number]- Ultrasound, Patient/Family education, Balance training, Stair  training, Joint mobilization, Spinal mobilization, Vestibular training, DME instructions, Cryotherapy, and Moist heat  PLAN FOR NEXT SESSION: check to see if imaging has been read; hip strengthening; toe clearance ; single leg balance   Kristeen Sar, PT 07/29/24 1:01 PM [facility] [address] [place], [place] [address] Phone # [phone] Fax [phone]

## 2024-08-05 ENCOUNTER — Ambulatory Visit: Payer: Self-pay | Attending: Neurology

## 2024-08-05 DIAGNOSIS — R262 Difficulty in walking, not elsewhere classified: Secondary | ICD-10-CM | POA: Diagnosis not present

## 2024-08-05 DIAGNOSIS — Z9181 History of falling: Secondary | ICD-10-CM | POA: Diagnosis not present

## 2024-08-05 DIAGNOSIS — R293 Abnormal posture: Secondary | ICD-10-CM | POA: Diagnosis not present

## 2024-08-05 DIAGNOSIS — M6281 Muscle weakness (generalized): Secondary | ICD-10-CM | POA: Diagnosis not present

## 2024-08-05 DIAGNOSIS — R252 Cramp and spasm: Secondary | ICD-10-CM | POA: Diagnosis not present

## 2024-08-05 DIAGNOSIS — R2689 Other abnormalities of gait and mobility: Secondary | ICD-10-CM | POA: Insufficient documentation

## 2024-08-05 NOTE — Therapy (Signed)
 OUTPATIENT PHYSICAL THERAPY LOWER EXTREMITY TREATMENT   Patient Name: Taylor Holloway MRN: [id number] DOB:1961-03-03, 63 y.o., female Today's Date: 08/07/2024  END OF SESSION:      Past Medical History:  Diagnosis Date   GERD (gastroesophageal reflux disease)    No pertinent past medical history    PONV (postoperative nausea and vomiting)    Past Surgical History:  Procedure Laterality Date   ABDOMINAL HYSTERECTOMY  11/13/2011   Procedure: HYSTERECTOMY ABDOMINAL;  Surgeon: Charlie CHRISTELLA Croak, MD;  Location: [facility];  Service: Gynecology;  Laterality: Left;  abdominal hysterectomy with left salpingo-ophoorectomy   ANTERIOR AND POSTERIOR REPAIR  11/13/2011   Procedure: ANTERIOR (CYSTOCELE) AND POSTERIOR REPAIR (RECTOCELE);  Surgeon: Glendia DELENA Elizabeth, MD;  Location: [facility];  Service: Urology;  Laterality: N/A;   DIAGNOSTIC LAPAROSCOPY  1990   LAPAROSCOPIC ASSISTED VAGINAL HYSTERECTOMY  11/13/2011   Procedure: LAPAROSCOPIC ASSISTED VAGINAL HYSTERECTOMY;  Surgeon: Charlie CHRISTELLA Croak, MD;  Location: [facility];  Service: Gynecology;  Laterality: N/A;  attempted   PUBOVAGINAL SLING  11/13/2011   Procedure: CARLOYN GLADE;  Surgeon: Glendia DELENA Elizabeth, MD;  Location: [facility];  Service: Urology;  Laterality: N/A;  sparc sling   Patient Active Problem List   Diagnosis Date Noted   Cough 11/09/2013   Pneumonia, organism unspecified(486) 11/09/2013   GERD 03/26/2009    PCP: Dr. Verena  REFERRING PROVIDER: Tobie Tonita POUR, DO  REFERRING DIAG: R29.2 (ICD-10-CM) - Hyperreflexia R26.81 (ICD-10-CM) - Unsteady gait R25.8 (ICD-10-CM) - Bradykinesia  THERAPY DIAG:  Muscle weakness (generalized)  Difficulty in walking, not elsewhere classified  History of falling  Cramp and spasm  Abnormal posture  Rationale for Evaluation and Treatment: Rehabilitation  ONSET DATE: 07/11/2024  SUBJECTIVE:   SUBJECTIVE STATEMENT: Patient reports she is doing well and feels is gaining strength.  No  falls or near falls.    Initial eval: I had 4 falls within 6 months, 2 within weeks of one another.  I went to my primary doctor and they have done multiple studies with no abnormal findings.  Sent her for neuro consult.  No significant findings.  I am retired,  I go workout 3 times per week, we go out and about for various things.  No family close by.  We do travel some.  We were recently on a trip and were walking down a sidewalk and I fell when I caught my foot on the seam in the sidewalk.  I would like to work on my balance and walking to avoid any further falls.    PERTINENT HISTORY: na PAIN:  07/18/24 Are you having pain? No  PRECAUTIONS: Fall  RED FLAGS: None   WEIGHT BEARING RESTRICTIONS: No  FALLS:  Has patient fallen in last 6 months? Yes. Number of falls 4  LIVING ENVIRONMENT: Lives with: lives with their spouse Lives in: House/apartment Stairs: Yes: Internal: 16 steps; on right going up and External: 5 steps; on right going up Has following equipment at home: None  OCCUPATION: Retired Engineer, civil (consulting)  PLOF: Independent, Independent with basic ADLs, Independent with household mobility without device, Independent with community mobility without device, Independent with homemaking with ambulation, Independent with gait, and Independent with transfers  PATIENT GOALS: I would like to work on my balance and walking to avoid any further falls.    NEXT MD VISIT: prn  OBJECTIVE:  Note: Objective measures were completed at Evaluation unless otherwise noted.  DIAGNOSTIC FINDINGS: all diagnostics normal  COGNITION: Overall cognitive status: Within functional limits  for tasks assessed     SENSATION: WFL  MUSCLE LENGTH: Hamstrings: Right 55 deg; Left 60 deg Thomas test: Right pos; Left pos  POSTURE: No Significant postural limitations  PALPATION: Slight lumbar scoliosis noted: trunk shift to right  LOWER EXTREMITY ROM:  WNL  LOWER EXTREMITY MMT:  Generally 4+/5 with  exception of hip abduction : right 4-/5, left 4/5,  hip ER right 3+/5, left 4-/5, hip extension 4-/5 bilaterally   FUNCTIONAL TESTS:  5 times sit to stand: 13.27 sec Timed up and go (TUG): 7.55 sec  GAIT: Distance walked: 100 feet Assistive device utilized: None Level of assistance: Complete Independence Comments: Mild trendelenburg but good step length and heel strike                                                                                                                                TREATMENT DATE:  08/05/2024 Treadmill x 8 min (PT present to discuss status and observe gait- gait training : educated patient that she had normal arm swing on the left but right arm does not swing: encouraged symmetrical arm swing) In // bars: walking across foam balance beam x 5 laps fwd then 5 laps lateral side step Slider star drill (5 points) x 5 each LE (with mirror for visual feedback to maintain proper alignment) Eccentric step downs on 4 step fwd x 10 then lateral x 10 (with mirror for visual feedback to maintain proper alignment) Lateral band walks x 3 laps with yellow tband tied (10 step each way, vc's for keeping tension in the band) Supine bridging x 20 Supine hamstring curl in bridge position: heels on 2 separate towels: out with both feet then pull one in at a time x 10 total  07/29/2024 Nu Step seat 6, UE 9, Lev 6 x 7 min,560 steps 4 lengths 10 steps lateral band walks with red,  Monster walks 4 lengths 3 D standing hip red band x 10 B, flex, abd, ext holding bars Tandem stance x 1 B to failure, SLS x 1 B Hip Bilateral IR with ball squeeze/ER each leg separately with red band x 10 ea seated on high table Cone touches on chair x 2 to failure SLS with alternate UE raises x 5 Airex beam x 6 lengths forward, 6 lengths sideways. FABER stretch 3 x 30 sec B Updated HEP with Medbridge pictures    07/25/24  Nustep x 5 min level 7 PT present to discuss status and progress Single leg  Hip ABD into purple ball 5 sec hold x 10 B Hip hike on bottom stairs x 30 bilateral Single leg on therapad + opposite leg swing (forwards, lateral, backwards) x 10 bilateral (trying not to hold on but standing next to barre for support as needed) Discussion of possible causes of left leg weakness 6inch step ups with 10# unilateral KB hold x 12 bilateral  Hip matrix abduction (30#) & extension (40#) 2 x  10 Farmer's carry holding two 10# DB x 1 lap around both gyms  07/15/24  Initial eval completed and initiated HEP Educated patient on various possible causes for her falling Gait training during observation: reinforced heel strike and step length Educated on acquired scoliosis: to sit in front of mirror to observe and work on correcting and stretching to avoid progression   PATIENT EDUCATION:  Education details: See above Person educated: Patient Education method: Programmer, multimedia, Facilities manager, Verbal cues, and Handouts Education comprehension: verbalized understanding, returned demonstration, and verbal cues required  HOME EXERCISE PROGRAM: Access Code: [email or web address] URL: [email or web address] Date: 07/29/2024 Prepared by: Grayce Sheldon  Exercises - Side Stepping with Resistance at Feet  - 1 x daily - 3 x weekly - 3 sets - 10 reps - Forward Monster Walks  - 1 x daily - 3 x weekly - 3 sets - 10 reps - Seated Hip External Rotation with Resistance  - 1 x daily - 7 x weekly - 1-2 sets - 10 reps - Standing Hip Extension with Resistance at Ankles and Counter Support  - 1 x daily - 7 x weekly - 1-2 sets - 10 reps - Standing Hip Flexion with Anchored Resistance and Chair Support  - 1 x daily - 7 x weekly - 1-2 sets - 10 reps - Standing Hip Abduction Kicks  - 1 x daily - 7 x weekly - 1-2 sets - 10 reps - Supine Hip External Rotation Stretch  - 1 x daily - 7 x weekly - 1 sets - 3 reps - 20-30 hold Access Code: [email or web address] URL: [email or web address] Date: 07/22/2024 Prepared  by: Mliss  Exercises - Standing Hamstring Stretch on Chair  - 1 x daily - 7 x weekly - 1 sets - 3 reps - 30 sec hold - Quadricep Stretch with Chair and Counter Support  - 1 x daily - 7 x weekly - 1 sets - 3 reps - 30 sec hold - Seated Figure 4 Piriformis Stretch  - 1 x daily - 7 x weekly - 1 sets - 3 reps - 30 sed hold - Seated Lateral Trunk Stretch on Swiss Ball  - 1 x daily - 7 x weekly - 1 sets - 10 reps - 5 sec hold - Clamshell with Resistance  - 1 x daily - 3 x weekly - 2-3 sets - 10 reps - Standing Isometric Hip Abduction with Ball on Wall  - 1 x daily - 3 x weekly - 2 sets - 10 reps - 5 sec hold - Hip Hiking on Step  - 1 x daily - 3 x weekly - 2 sets - 10 reps - Wall Squat  - 1 x daily - 3 x weekly - 2 sets - 10 reps  ASSESSMENT:  CLINICAL IMPRESSION: Patient is progressing appropriately and tolerating high level strength and balance training.  She continues to need vc's for alignment. We worked on symmetrical arm swing today along with functional strength training and balance training. She was quite fatigued with any exercises that challenge the proximal hip musculature.   She is well motivated and compliant.  She should continue to do well.   Patient will benefit from skilled PT to address the below impairments and improve overall function.   OBJECTIVE IMPAIRMENTS: Abnormal gait, decreased balance, difficulty walking, decreased strength, increased muscle spasms, impaired flexibility, postural dysfunction, and pain.   ACTIVITY LIMITATIONS: stairs, transfers, and bed mobility  PARTICIPATION LIMITATIONS: community activity and yard work  PERSONAL FACTORS: No significant other co-morbidities are  also affecting patient's functional outcome.   REHAB POTENTIAL: Excellent  CLINICAL DECISION MAKING: Stable/uncomplicated  EVALUATION COMPLEXITY: Low   GOALS: Goals reviewed with patient? Yes  SHORT TERM GOALS: Target date: 08/13/2024  Patient will be independent with initial HEP   Baseline: Goal status: MET 08/05/24  2.  Eliminate right low back pain Baseline:  Goal status: MET 08/05/24   LONG TERM GOALS: Target date: 09/10/2024  Patient to be independent with advanced HEP  Baseline:  Goal status: INITIAL  2.  No falls during episode of skilled PT Baseline:  Goal status: INITIAL  3.  Eliminate trendelenburg gait Baseline:  Goal status: INITIAL  4.  Patient to be able to demonstrate SLS on each LE x 20 sec without LOB Baseline:  Goal status: INITIAL  5.  Patient to demonstrate 30 sec on each task of modified CTSIB with no LOB Baseline:  Goal status: INITIAL    PLAN:  PT FREQUENCY: 1-2x/week  PT DURATION: 8 weeks  PLANNED INTERVENTIONS: 97110-Therapeutic exercises, 97530- Therapeutic activity, [id number]- Neuromuscular re-education, 97535- Self Care, [id number]- Manual therapy, [phone]- Gait training, [phone]- Canalith repositioning, [id number]- Aquatic Therapy, [phone]- Electrical stimulation (unattended), [phone]- Electrical stimulation (manual), [id number]- Vasopneumatic device, 97035- Ultrasound, Patient/Family education, Balance training, Stair training, Joint mobilization, Spinal mobilization, Vestibular training, DME instructions, Cryotherapy, and Moist heat  PLAN FOR NEXT SESSION: Continue LE and proximal hip concentrated strengthening, balance training, check for MRI results   Jahdiel Krol B. Glady Ouderkirk, PT 08/07/24 9:25 PM [facility] [address] [place], [place] [address] Phone # [phone] Fax [phone]

## 2024-08-17 ENCOUNTER — Ambulatory Visit: Admitting: Physical Therapy

## 2024-08-17 ENCOUNTER — Encounter: Payer: Self-pay | Admitting: Physical Therapy

## 2024-08-17 DIAGNOSIS — Z9181 History of falling: Secondary | ICD-10-CM

## 2024-08-17 DIAGNOSIS — R2689 Other abnormalities of gait and mobility: Secondary | ICD-10-CM

## 2024-08-17 DIAGNOSIS — R262 Difficulty in walking, not elsewhere classified: Secondary | ICD-10-CM | POA: Diagnosis not present

## 2024-08-17 DIAGNOSIS — M6281 Muscle weakness (generalized): Secondary | ICD-10-CM | POA: Diagnosis not present

## 2024-08-17 DIAGNOSIS — R293 Abnormal posture: Secondary | ICD-10-CM | POA: Diagnosis not present

## 2024-08-17 DIAGNOSIS — R252 Cramp and spasm: Secondary | ICD-10-CM | POA: Diagnosis not present

## 2024-08-17 NOTE — Therapy (Signed)
 OUTPATIENT PHYSICAL THERAPY LOWER EXTREMITY TREATMENT   Patient Name: Taylor Holloway MRN: [id number] DOB:[birth year], 63 y.o., female Today's Date: 08/17/2024  END OF SESSION:  PT End of Session - 08/17/24 1224     Visit Number 7    Date for Recertification  09/09/24    Authorization Type BCBS    Progress Note Due on Visit 10    PT Start Time 1222    PT Stop Time 1305    PT Time Calculation (min) 43 min    Activity Tolerance Patient tolerated treatment well    Behavior During Therapy WFL for tasks assessed/performed             Past Medical History:  Diagnosis Date   GERD (gastroesophageal reflux disease)    No pertinent past medical history    PONV (postoperative nausea and vomiting)    Past Surgical History:  Procedure Laterality Date   ABDOMINAL HYSTERECTOMY  11/13/2011   Procedure: HYSTERECTOMY ABDOMINAL;  Surgeon: Charlie CHRISTELLA Croak, MD;  Location: [facility];  Service: Gynecology;  Laterality: Left;  abdominal hysterectomy with left salpingo-ophoorectomy   ANTERIOR AND POSTERIOR REPAIR  11/13/2011   Procedure: ANTERIOR (CYSTOCELE) AND POSTERIOR REPAIR (RECTOCELE);  Surgeon: Glendia DELENA Elizabeth, MD;  Location: [facility];  Service: Urology;  Laterality: N/A;   DIAGNOSTIC LAPAROSCOPY  1990   LAPAROSCOPIC ASSISTED VAGINAL HYSTERECTOMY  11/13/2011   Procedure: LAPAROSCOPIC ASSISTED VAGINAL HYSTERECTOMY;  Surgeon: Charlie CHRISTELLA Croak, MD;  Location: [facility];  Service: Gynecology;  Laterality: N/A;  attempted   PUBOVAGINAL SLING  11/13/2011   Procedure: CARLOYN GLADE;  Surgeon: Glendia DELENA Elizabeth, MD;  Location: [facility];  Service: Urology;  Laterality: N/A;  sparc sling   Patient Active Problem List   Diagnosis Date Noted   Cough 11/09/2013   Pneumonia, organism unspecified(486) 11/09/2013   GERD 03/26/2009    PCP: Dr. Verena  REFERRING PROVIDER: Tobie Tonita POUR, DO  REFERRING DIAG: R29.2 (ICD-10-CM) - Hyperreflexia R26.81 (ICD-10-CM) - Unsteady gait R25.8 (ICD-10-CM) -  Bradykinesia  THERAPY DIAG:  Muscle weakness (generalized)  Difficulty in walking, not elsewhere classified  History of falling  Cramp and spasm  Other abnormalities of gait and mobility  Rationale for Evaluation and Treatment: Rehabilitation  ONSET DATE: 07/11/2024  SUBJECTIVE:   SUBJECTIVE STATEMENT: I've been paying attention to how I do things at home.  Initial eval: I had 4 falls within 6 months, 2 within weeks of one another.  I went to my primary doctor and they have done multiple studies with no abnormal findings.  Sent her for neuro consult.  No significant findings.  I am retired,  I go workout 3 times per week, we go out and about for various things.  No family close by.  We do travel some.  We were recently on a trip and were walking down a sidewalk and I fell when I caught my foot on the seam in the sidewalk.  I would like to work on my balance and walking to avoid any further falls.    PERTINENT HISTORY: na PAIN:  07/18/24 Are you having pain? No  PRECAUTIONS: Fall  RED FLAGS: None   WEIGHT BEARING RESTRICTIONS: No  FALLS:  Has patient fallen in last 6 months? Yes. Number of falls 4  LIVING ENVIRONMENT: Lives with: lives with their spouse Lives in: House/apartment Stairs: Yes: Internal: 16 steps; on right going up and External: 5 steps; on right going up Has following equipment at home: None  OCCUPATION: Retired Engineer, civil (consulting)  PLOF: Independent, Independent with basic ADLs, Independent with household mobility without device, Independent with community mobility without device, Independent with homemaking with ambulation, Independent with gait, and Independent with transfers  PATIENT GOALS: I would like to work on my balance and walking to avoid any further falls.    NEXT MD VISIT: prn  OBJECTIVE:  Note: Objective measures were completed at Evaluation unless otherwise noted.  DIAGNOSTIC FINDINGS: all diagnostics normal  COGNITION: Overall cognitive  status: Within functional limits for tasks assessed     SENSATION: WFL  MUSCLE LENGTH: Hamstrings: Right 55 deg; Left 60 deg Thomas test: Right pos; Left pos  POSTURE: No Significant postural limitations  PALPATION: Slight lumbar scoliosis noted: trunk shift to right  LOWER EXTREMITY ROM:  WNL  LOWER EXTREMITY MMT:  Generally 4+/5 with exception of hip abduction : right 4-/5, left 4/5,  hip ER right 3+/5, left 4-/5, hip extension 4-/5 bilaterally   FUNCTIONAL TESTS:  5 times sit to stand: 13.27 sec Timed up and go (TUG): 7.55 sec  GAIT: Distance walked: 100 feet Assistive device utilized: None Level of assistance: Complete Independence Comments: Mild trendelenburg but good step length and heel strike                                                                                                                                TREATMENT DATE:  08/17/2024 Nustep x 5 min L 5 (PT present to discuss status) Slider star drill (5 points) x 5 -6each LE (with mirror for visual feedback to maintain proper alignment) Eccentric step downs on 4 step fwd x 10 then lateral x 10 (with mirror for visual feedback to maintain proper alignment) Lateral band walks x 3 laps with yellow tband tied (10 step each way, vc's for keeping tension in the band) Static lunge x 10 B at mirror Supine bridging + hip ABD x 20 with red band, then 10 with red loop S/L clam red loop x 20 B S/L hip ABD red loop x 20 B Supine hamstring curl in bridge position: heels on 2 separate towels: out with both feet then pull one in at a time x 2 total cramped today She could bridge with S/L sllde outs x 5 R Bridge +HS curl on red plyo ball x 5 HS stretch with strap x 30 sec B Farmer carry 10# unilaterally at side and shoulder B one lap of hallway ea. March with prolonged hold 10#KB unilaterally x 10 B   08/05/2024 Treadmill x 8 min (PT present to discuss status and observe gait- gait training : educated patient  that she had normal arm swing on the left but right arm does not swing: encouraged symmetrical arm swing) In // bars: walking across foam balance beam x 5 laps fwd then 5 laps lateral side step Slider star drill (5 points) x 5 each LE (with mirror for visual feedback to maintain proper alignment) Eccentric step downs on 4  step fwd x 10 then lateral x 10 (with mirror for visual feedback to maintain proper alignment) Lateral band walks x 3 laps with yellow tband tied (10 step each way, vc's for keeping tension in the band) Supine bridging x 20 Supine hamstring curl in bridge position: heels on 2 separate towels: out with both feet then pull one in at a time x 10 total  07/29/2024 Nu Step seat 6, UE 9, Lev 6 x 7 min,560 steps 4 lengths 10 steps lateral band walks with red,  Monster walks 4 lengths 3 D standing hip red band x 10 B, flex, abd, ext holding bars Tandem stance x 1 B to failure, SLS x 1 B Hip Bilateral IR with ball squeeze/ER each leg separately with red band x 10 ea seated on high table Cone touches on chair x 2 to failure SLS with alternate UE raises x 5 Airex beam x 6 lengths forward, 6 lengths sideways. FABER stretch 3 x 30 sec B Updated HEP with Medbridge pictures    07/25/24  Nustep x 5 min level 7 PT present to discuss status and progress Single leg Hip ABD into purple ball 5 sec hold x 10 B Hip hike on bottom stairs x 30 bilateral Single leg on therapad + opposite leg swing (forwards, lateral, backwards) x 10 bilateral (trying not to hold on but standing next to barre for support as needed) Discussion of possible causes of left leg weakness 6inch step ups with 10# unilateral KB hold x 12 bilateral  Hip matrix abduction (30#) & extension (40#) 2 x 10 Farmer's carry holding two 10# DB x 1 lap around both gyms  07/15/24  Initial eval completed and initiated HEP Educated patient on various possible causes for her falling Gait training during observation: reinforced heel  strike and step length Educated on acquired scoliosis: to sit in front of mirror to observe and work on correcting and stretching to avoid progression   PATIENT EDUCATION:  Education details: See above Person educated: Patient Education method: Programmer, multimedia, Facilities manager, Verbal cues, and Handouts Education comprehension: verbalized understanding, returned demonstration, and verbal cues required  HOME EXERCISE PROGRAM: Access Code: [email or web address] URL: [email or web address] Date: 07/29/2024 Prepared by: Grayce Sheldon  Exercises - Side Stepping with Resistance at Feet  - 1 x daily - 3 x weekly - 3 sets - 10 reps - Forward Monster Walks  - 1 x daily - 3 x weekly - 3 sets - 10 reps - Seated Hip External Rotation with Resistance  - 1 x daily - 7 x weekly - 1-2 sets - 10 reps - Standing Hip Extension with Resistance at Ankles and Counter Support  - 1 x daily - 7 x weekly - 1-2 sets - 10 reps - Standing Hip Flexion with Anchored Resistance and Chair Support  - 1 x daily - 7 x weekly - 1-2 sets - 10 reps - Standing Hip Abduction Kicks  - 1 x daily - 7 x weekly - 1-2 sets - 10 reps - Supine Hip External Rotation Stretch  - 1 x daily - 7 x weekly - 1 sets - 3 reps - 20-30 hold Access Code: [email or web address] URL: [email or web address] Date: 07/22/2024 Prepared by: Mliss  Exercises - Standing Hamstring Stretch on Chair  - 1 x daily - 7 x weekly - 1 sets - 3 reps - 30 sec hold - Quadricep Stretch with Chair and Counter Support  - 1 x daily - 7 x weekly - 1 sets - 3 reps -  30 sec hold - Seated Figure 4 Piriformis Stretch  - 1 x daily - 7 x weekly - 1 sets - 3 reps - 30 sed hold - Seated Lateral Trunk Stretch on Swiss Ball  - 1 x daily - 7 x weekly - 1 sets - 10 reps - 5 sec hold - Clamshell with Resistance  - 1 x daily - 3 x weekly - 2-3 sets - 10 reps - Standing Isometric Hip Abduction with Ball on Wall  - 1 x daily - 3 x weekly - 2 sets - 10 reps - 5 sec hold - Hip Hiking on Step   - 1 x daily - 3 x weekly - 2 sets - 10 reps - Wall Squat  - 1 x daily - 3 x weekly - 2 sets - 10 reps  ASSESSMENT:  CLINICAL IMPRESSION: Continued to work on proximal hip strength to help decrease valgus. Increased resistance and had patient advance clam to S/L. Prolonged hold with march was challenging. She reports she is working on arm swing at home with gait and paying attention to her knee position with functional activities. She continues to demonstrate R knee valgus with heel taps. She continues to demonstrate potential for improvement and would benefit from continued skilled therapy to address impairments.     OBJECTIVE IMPAIRMENTS: Abnormal gait, decreased balance, difficulty walking, decreased strength, increased muscle spasms, impaired flexibility, postural dysfunction, and pain.   ACTIVITY LIMITATIONS: stairs, transfers, and bed mobility  PARTICIPATION LIMITATIONS: community activity and yard work  PERSONAL FACTORS: No significant other co-morbidities are also affecting patient's functional outcome.   REHAB POTENTIAL: Excellent  CLINICAL DECISION MAKING: Stable/uncomplicated  EVALUATION COMPLEXITY: Low   GOALS: Goals reviewed with patient? Yes  SHORT TERM GOALS: Target date: 08/13/2024  Patient will be independent with initial HEP  Baseline: Goal status: MET 08/05/24  2.  Eliminate right low back pain Baseline:  Goal status: MET 08/05/24   LONG TERM GOALS: Target date: 09/10/2024  Patient to be independent with advanced HEP  Baseline:  Goal status: INITIAL  2.  No falls during episode of skilled PT Baseline:  Goal status: INITIAL  3.  Eliminate trendelenburg gait Baseline:  Goal status: INITIAL  4.  Patient to be able to demonstrate SLS on each LE x 20 sec without LOB Baseline:  Goal status: INITIAL  5.  Patient to demonstrate 30 sec on each task of modified CTSIB with no LOB Baseline:  Goal status: INITIAL    PLAN:  PT FREQUENCY:  1-2x/week  PT DURATION: 8 weeks  PLANNED INTERVENTIONS: 97110-Therapeutic exercises, 97530- Therapeutic activity, 97112- Neuromuscular re-education, [id number]- Self Care, 02859- Manual therapy, [phone]- Gait training, [phone]- Canalith repositioning, [id number]- Aquatic Therapy, [phone]- Electrical stimulation (unattended), [phone]- Electrical stimulation (manual), 97016- Vasopneumatic device, [id number]- Ultrasound, Patient/Family education, Balance training, Stair training, Joint mobilization, Spinal mobilization, Vestibular training, DME instructions, Cryotherapy, and Moist heat  PLAN FOR NEXT SESSION: Continue LE and proximal hip concentrated strengthening, balance training, check for MRI results   Mliss Cummins, PT  08/17/24 1:11 PM [facility] [address] [place], [place] [address] Phone # [phone] Fax [phone]

## 2024-08-23 ENCOUNTER — Ambulatory Visit: Admitting: Physical Therapy

## 2024-08-23 DIAGNOSIS — R2689 Other abnormalities of gait and mobility: Secondary | ICD-10-CM | POA: Diagnosis not present

## 2024-08-23 DIAGNOSIS — M6281 Muscle weakness (generalized): Secondary | ICD-10-CM

## 2024-08-23 DIAGNOSIS — R262 Difficulty in walking, not elsewhere classified: Secondary | ICD-10-CM

## 2024-08-23 DIAGNOSIS — Z9181 History of falling: Secondary | ICD-10-CM

## 2024-08-23 DIAGNOSIS — R293 Abnormal posture: Secondary | ICD-10-CM | POA: Diagnosis not present

## 2024-08-23 DIAGNOSIS — R252 Cramp and spasm: Secondary | ICD-10-CM | POA: Diagnosis not present

## 2024-08-23 NOTE — Therapy (Signed)
 OUTPATIENT PHYSICAL THERAPY LOWER EXTREMITY TREATMENT   Patient Name: Taylor Holloway MRN: [id number] DOB:1961/10/27, 63 y.o., female Today's Date: 08/23/2024  END OF SESSION:  PT End of Session - 08/23/24 1616     Visit Number 8    Date for Recertification  09/09/24    Authorization Type [employer]    Progress Note Due on Visit 10    PT Start Time 1616    PT Stop Time 1655    PT Time Calculation (min) 39 min    Activity Tolerance Patient tolerated treatment well             Past Medical History:  Diagnosis Date   GERD (gastroesophageal reflux disease)    No pertinent past medical history    PONV (postoperative nausea and vomiting)    Past Surgical History:  Procedure Laterality Date   ABDOMINAL HYSTERECTOMY  11/13/2011   Procedure: HYSTERECTOMY ABDOMINAL;  Surgeon: Charlie CHRISTELLA Croak, MD;  Location: [facility];  Service: Gynecology;  Laterality: Left;  abdominal hysterectomy with left salpingo-ophoorectomy   ANTERIOR AND POSTERIOR REPAIR  11/13/2011   Procedure: ANTERIOR (CYSTOCELE) AND POSTERIOR REPAIR (RECTOCELE);  Surgeon: Glendia DELENA Elizabeth, MD;  Location: [facility];  Service: Urology;  Laterality: N/A;   DIAGNOSTIC LAPAROSCOPY  1990   LAPAROSCOPIC ASSISTED VAGINAL HYSTERECTOMY  11/13/2011   Procedure: LAPAROSCOPIC ASSISTED VAGINAL HYSTERECTOMY;  Surgeon: Charlie CHRISTELLA Croak, MD;  Location: [facility];  Service: Gynecology;  Laterality: N/A;  attempted   PUBOVAGINAL SLING  11/13/2011   Procedure: CARLOYN GLADE;  Surgeon: Glendia DELENA Elizabeth, MD;  Location: [facility];  Service: Urology;  Laterality: N/A;  sparc sling   Patient Active Problem List   Diagnosis Date Noted   Cough 11/09/2013   Pneumonia, organism unspecified(486) 11/09/2013   GERD 03/26/2009    PCP: Dr. Verena  REFERRING PROVIDER: Tobie Tonita POUR, DO  REFERRING DIAG: R29.2 (ICD-10-CM) - Hyperreflexia R26.81 (ICD-10-CM) - Unsteady gait R25.8 (ICD-10-CM) - Bradykinesia  THERAPY DIAG:  Muscle weakness  (generalized)  Difficulty in walking, not elsewhere classified  History of falling  Rationale for Evaluation and Treatment: Rehabilitation  ONSET DATE: 07/11/2024  SUBJECTIVE:   SUBJECTIVE STATEMENT: MRI brain and spine normal.  No recent falls.  Last fall in July (talking on phone while coming down steps).  My balance is not what I wish it was.  I need to work on the balance. I'm getting stronger.  Walked 3 miles on the treadmill on an incline.  My foot is sore.     Initial eval: I had 4 falls within 6 months, 2 within weeks of one another.  I went to my primary doctor and they have done multiple studies with no abnormal findings.  Sent her for neuro consult.  No significant findings.  I am retired,  I go workout 3 times per week, we go out and about for various things.  No family close by.  We do travel some.  We were recently on a trip and were walking down a sidewalk and I fell when I caught my foot on the seam in the sidewalk.  I would like to work on my balance and walking to avoid any further falls.    PERTINENT HISTORY: na PAIN:  08/23/24 Are you having pain? No  PRECAUTIONS: Fall  RED FLAGS: None   WEIGHT BEARING RESTRICTIONS: No  FALLS:  Has patient fallen in last 6 months? Yes. Number of falls 4  LIVING ENVIRONMENT: Lives with: lives with their spouse Lives in:  House/apartment Stairs: Yes: Internal: 16 steps; on right going up and External: 5 steps; on right going up Has following equipment at home: None  OCCUPATION: Retired Engineer, civil (consulting)  PLOF: Independent, Independent with basic ADLs, Independent with household mobility without device, Independent with community mobility without device, Independent with homemaking with ambulation, Independent with gait, and Independent with transfers  PATIENT GOALS: I would like to work on my balance and walking to avoid any further falls.    NEXT MD VISIT: prn  OBJECTIVE:  Note: Objective measures were completed at Evaluation  unless otherwise noted.  DIAGNOSTIC FINDINGS: all diagnostics normal  COGNITION: Overall cognitive status: Within functional limits for tasks assessed     SENSATION: WFL  MUSCLE LENGTH: Hamstrings: Right 55 deg; Left 60 deg Thomas test: Right pos; Left pos  POSTURE: No Significant postural limitations  PALPATION: Slight lumbar scoliosis noted: trunk shift to right  LOWER EXTREMITY ROM:  WNL  LOWER EXTREMITY MMT:  Generally 4+/5 with exception of hip abduction : right 4-/5, left 4/5,  hip ER right 3+/5, left 4-/5, hip extension 4-/5 bilaterally   FUNCTIONAL TESTS:  5 times sit to stand: 13.27 sec Timed up and go (TUG): 7.55 sec  GAIT: Distance walked: 100 feet Assistive device utilized: None Level of assistance: Complete Independence Comments: Mild trendelenburg but good step length and heel strike  MINI-BESTest: Balance Evaluation Systems Test ANTICIPATORY: SIT TO STAND: (2) normal without use of hands and stabilizes independently     (1) Moderate comes to stand WITH use of hands on 1st attempt    (0) Severe: unable to stand up from chair without assistance OR needs several attempts with use of   hands Score: 2  2. RISE TO TOES: feet shoulder width apart. Hands on hips.  Rise as high as you can onto your toes. Try to hold this pose for 3 sec                          (2) stable for 3s with maximum height    (1) heels up, but not full range (smaller than when holding hands) OR noticeable instability for 3 sec                           (0) < or equat to 3 s  Score: 2  3. STAND ON ONE LEG: look straight ahead. Hands on hips. Lift your leg off the ground.     Left:  trial 1 __17__ trial 2_ 8__                                    Right: Trial 1:___20__   Trial 2:__20_____   (2) 20 s       (2)  20 s                (1) < 20      (1) < 20 s   (0) Unable      (0) unable Score (use the worst side):1  REACTIVE POSTURAL CONTROL 4. COMPENSATORY STEPPING CORRECTION FORWARD  stand with feet shoulder width apart, arms at your sides. Lean forward against my hands beyond your forward limits.  When I let go, do whatever is necessary, including taking a step, to avoid a fall   (2) recovers independently  with a single, large step, to avoid a  fall   (1) more than one step used to recover equilibrium   (0) No step OR would fall if not caught Score: 1  5. COMPENSATORY STEPPING CORRECTION BACKWARD: Lean backward against my hands beyond your backward limits.  When I let go, do whatever is necessary, including a step to avoid a fall. (2) recovers independently  with a single, large step, to avoid a fall   (1) more than one step used to recover equilibrium   (0) No step OR would fall if not caught Score: 1  6. COMPENSATORY STEPPING LATERAL: Lean into my hand beyond your sideways limit.  When I let go, do whatever is necessary, including taking a step, to avoid a fall. Left           Right   (2) recovers independently with 1 step (crossover or lateral OK)   (1) several steps to recover equilibrium   (0) falls or cannot step Score: (use the side with the lowest score) 1  SENSORY ORIENTATION 7. STANCE FEET TOGETHER EYES OPEN  firm surface Be as stable and still as possible until I say stop   (2) 30s   (1) < 30 s   (0) unable Score: 2  8. STANCE ON FOAM EYES CLOSED feet together, eyes closed, hands on hips. Be as stable and still as you can until I stay stop.  I will start timing when you close your eyes. Time in seconds:      (2) 30s   (1) <30 s   (0) unable Score: 2  9. INCLINE EYES CLOSED: Stand on incline with feet shoulder width apart, arms down at your sides.  I will start timing when you close your eyes   (2) Stands 30 s and aligns with gravity   (1) stands < 30 s OR aligns with surface   (0) unable Score:  2  DYNAMIC GAIT 10. CHANGE IN GAIT SPEED: begin walking at your normal speed, when I tell you fast, walk as fast as you can, when I say slow walk  very slowly   (2) significantly changes walking speed without imbalance   (1) unable to change walking speed or signs of imbalance   (0) unable to achieve significant change in walking speed AND signes of imbalance Score:2  11. WALK WITH HEAD TURNS HORIZONTAL: begin walking at normal speed, when I say right, turn your head to the right, when I say left turn your head to the left.  Try to keep yourself walking in a straight line   (2) performs head turns with no change in gait speed and good balance   (1) performs head turns with reduction in gait speed   (0)  performs head turns with imbalance Score:2  12. WALK WITH PIVOT TURNS: begin walking at your normal speed.  When I tell you to turn and stop, turn as quickly as you can, face the opposite direction and stop.  After the turn, your feet should be close together   (2) turns with feet close FAST in 3 steps with good balance   (1) turns with feet close SLOW > 4 steps with good balance   (0) cannot turn with feet close at any speed without imbalance Score:2  13. STEP OVER OBSTACLES:  ( 9 inch height 10 feet away from subject) begin walking at your normal speed.  When you get to the box, step over it, not around it and keep walking   (2) Able to step over  box with minimal change of gait speed and good balance   (1) Steps over box but touches box OR displays cautious behavior by slowing gait   (0) Unable to step over box OR steps around box Score: 2  14. TIMED UP AND GO NORMAL AND WITH DUAL TASK: 3 METER WALK: When I say go walk at your your normal speed across the tape, turn around and come back to sit in the chair   Time: Count backwards by 3s starting at    20.  When I say go, stand up from the chair, walk at your normal speed across the tape on the floor, turn around, come back to sit in the chair.  Continue counting backwards the entire time.  Time:   (2) No noticeable change in sitting, standing or walking while backward counting  when compared to TUG without dual task   (1) Dual task affects either counting OR walking (>10%)    (0) stops counting while walking OR stops walking while counting If gait speed slows more than 10% between TUG without and with dual task, the score should be decreased by a point Score: 1  Total score:         23  /28                                                                                                                                    TREATMENT DATE:  08/23/2024 Nustep x 5 min L 5 (PT present to discuss status) Mini BEST as above Clock Yourself: simple clock 50 SPM 3 min Clock Yourself Brain Games months of the year 40 spm 3 min Walking with 10# weight unilateral with cognitive dual tasks Walking with pair of 10# weights (farmers hold and shoulder hold) with dual cog tasks Box stepping forward, lateral and backward  08/17/2024 Nustep x 5 min L 5 (PT present to discuss status) Slider star drill (5 points) x 5 -6each LE (with mirror for visual feedback to maintain proper alignment) Eccentric step downs on 4 step fwd x 10 then lateral x 10 (with mirror for visual feedback to maintain proper alignment) Lateral band walks x 3 laps with yellow tband tied (10 step each way, vc's for keeping tension in the band) Static lunge x 10 B at mirror Supine bridging + hip ABD x 20 with red band, then 10 with red loop S/L clam red loop x 20 B S/L hip ABD red loop x 20 B Supine hamstring curl in bridge position: heels on 2 separate towels: out with both feet then pull one in at a time x 2 total cramped today She could bridge with S/L sllde outs x 5 R Bridge +HS curl on red plyo ball x 5 HS stretch with strap x 30 sec B Farmer carry 10# unilaterally at side and shoulder B one lap of hallway ea. March with prolonged hold 10#KB unilaterally x 10  B   08/05/2024 Treadmill x 8 min (PT present to discuss status and observe gait- gait training : educated patient that she had normal arm swing  on the left but right arm does not swing: encouraged symmetrical arm swing) In // bars: walking across foam balance beam x 5 laps fwd then 5 laps lateral side step Slider star drill (5 points) x 5 each LE (with mirror for visual feedback to maintain proper alignment) Eccentric step downs on 4 step fwd x 10 then lateral x 10 (with mirror for visual feedback to maintain proper alignment) Lateral band walks x 3 laps with yellow tband tied (10 step each way, vc's for keeping tension in the band) Supine bridging x 20 Supine hamstring curl in bridge position: heels on 2 separate towels: out with both feet then pull one in at a time x 10 total  07/29/2024 Nu Step seat 6, UE 9, Lev 6 x 7 min,560 steps 4 lengths 10 steps lateral band walks with red,  Monster walks 4 lengths 3 D standing hip red band x 10 B, flex, abd, ext holding bars Tandem stance x 1 B to failure, SLS x 1 B Hip Bilateral IR with ball squeeze/ER each leg separately with red band x 10 ea seated on high table Cone touches on chair x 2 to failure SLS with alternate UE raises x 5 Airex beam x 6 lengths forward, 6 lengths sideways. FABER stretch 3 x 30 sec B Updated HEP with Medbridge pictures    07/25/24  Nustep x 5 min level 7 PT present to discuss status and progress Single leg Hip ABD into purple ball 5 sec hold x 10 B Hip hike on bottom stairs x 30 bilateral Single leg on therapad + opposite leg swing (forwards, lateral, backwards) x 10 bilateral (trying not to hold on but standing next to barre for support as needed) Discussion of possible causes of left leg weakness 6inch step ups with 10# unilateral KB hold x 12 bilateral  Hip matrix abduction (30#) & extension (40#) 2 x 10 Farmer's carry holding two 10# DB x 1 lap around both gyms  07/15/24  Initial eval completed and initiated HEP Educated patient on various possible causes for her falling Gait training during observation: reinforced heel strike and step length Educated  on acquired scoliosis: to sit in front of mirror to observe and work on correcting and stretching to avoid progression   PATIENT EDUCATION:  Education details: See above Person educated: Patient Education method: Programmer, multimedia, Facilities manager, Verbal cues, and Handouts Education comprehension: verbalized understanding, returned demonstration, and verbal cues required  HOME EXERCISE PROGRAM: Access Code: [email or web address] URL: [email or web address] Date: 07/29/2024 Prepared by: Grayce Sheldon  Exercises - Side Stepping with Resistance at Feet  - 1 x daily - 3 x weekly - 3 sets - 10 reps - Forward Monster Walks  - 1 x daily - 3 x weekly - 3 sets - 10 reps - Seated Hip External Rotation with Resistance  - 1 x daily - 7 x weekly - 1-2 sets - 10 reps - Standing Hip Extension with Resistance at Ankles and Counter Support  - 1 x daily - 7 x weekly - 1-2 sets - 10 reps - Standing Hip Flexion with Anchored Resistance and Chair Support  - 1 x daily - 7 x weekly - 1-2 sets - 10 reps - Standing Hip Abduction Kicks  - 1 x daily - 7 x weekly - 1-2 sets - 10 reps - Supine Hip  External Rotation Stretch  - 1 x daily - 7 x weekly - 1 sets - 3 reps - 20-30 hold Access Code: [email or web address] URL: [email or web address] Date: 07/22/2024 Prepared by: Mliss  Exercises - Standing Hamstring Stretch on Chair  - 1 x daily - 7 x weekly - 1 sets - 3 reps - 30 sec hold - Quadricep Stretch with Chair and Counter Support  - 1 x daily - 7 x weekly - 1 sets - 3 reps - 30 sec hold - Seated Figure 4 Piriformis Stretch  - 1 x daily - 7 x weekly - 1 sets - 3 reps - 30 sed hold - Seated Lateral Trunk Stretch on Swiss Ball  - 1 x daily - 7 x weekly - 1 sets - 10 reps - 5 sec hold - Clamshell with Resistance  - 1 x daily - 3 x weekly - 2-3 sets - 10 reps - Standing Isometric Hip Abduction with Ball on Wall  - 1 x daily - 3 x weekly - 2 sets - 10 reps - 5 sec hold - Hip Hiking on Step  - 1 x daily - 3 x weekly - 2  sets - 10 reps - Wall Squat  - 1 x daily - 3 x weekly - 2 sets - 10 reps  ASSESSMENT:  CLINICAL IMPRESSION: Belmira reports her strength is improving.  She denies recent falls but is concerned about her balance and describes the mechanism of her last fall (walking while distracted).  Her MiniBEST test indicates balance impairment with reactionary balance with several steps needed to regain balance as well as a reduction in gait speed with added cognitive challenge.  Her sensory orientation and dynamic gait balance were high scoring. Therapist providing close supervision for safety particularly with balance testing components.   OBJECTIVE IMPAIRMENTS: Abnormal gait, decreased balance, difficulty walking, decreased strength, increased muscle spasms, impaired flexibility, postural dysfunction, and pain.   ACTIVITY LIMITATIONS: stairs, transfers, and bed mobility  PARTICIPATION LIMITATIONS: community activity and yard work  PERSONAL FACTORS: No significant other co-morbidities are also affecting patient's functional outcome.   REHAB POTENTIAL: Excellent  CLINICAL DECISION MAKING: Stable/uncomplicated  EVALUATION COMPLEXITY: Low   GOALS: Goals reviewed with patient? Yes  SHORT TERM GOALS: Target date: 08/13/2024  Patient will be independent with initial HEP  Baseline: Goal status: MET 08/05/24  2.  Eliminate right low back pain Baseline:  Goal status: MET 08/05/24   LONG TERM GOALS: Target date: 09/10/2024  Patient to be independent with advanced HEP  Baseline:  Goal status: INITIAL  2.  No falls during episode of skilled PT Baseline:  Goal status: INITIAL  3.  Eliminate trendelenburg gait Baseline:  Goal status: INITIAL  4.  Patient to be able to demonstrate SLS on each LE x 20 sec without LOB Baseline:  Goal status: INITIAL  5.  Patient to demonstrate 30 sec on each task of modified CTSIB with no LOB Baseline:  Goal status: INITIAL    PLAN:  PT FREQUENCY:  1-2x/week  PT DURATION: 8 weeks  PLANNED INTERVENTIONS: 97110-Therapeutic exercises, 97530- Therapeutic activity, [id number]- Neuromuscular re-education, 97535- Self Care, [id number]- Manual therapy, [phone]- Gait training, [phone]- Canalith repositioning, [id number]- Aquatic Therapy, [phone]- Electrical stimulation (unattended), [phone]- Electrical stimulation (manual), [id number]- Vasopneumatic device, [id number]- Ultrasound, Patient/Family education, Balance training, Stair training, Joint mobilization, Spinal mobilization, Vestibular training, DME instructions, Cryotherapy, and Moist heat  PLAN FOR NEXT SESSION: Continue LE and proximal hip concentrated strengthening, balance training particularly reactive postural control and dual tasking  Glade Pesa, PT 08/23/24 10:05 PM Phone: [phone] Fax: [phone]  [facility] [address], [address] [place], [place] [address] Phone # [phone] Fax [phone]

## 2024-08-24 DIAGNOSIS — Z01419 Encounter for gynecological examination (general) (routine) without abnormal findings: Secondary | ICD-10-CM | POA: Diagnosis not present

## 2024-08-25 DIAGNOSIS — Z23 Encounter for immunization: Secondary | ICD-10-CM | POA: Diagnosis not present

## 2024-08-26 ENCOUNTER — Ambulatory Visit

## 2024-08-26 DIAGNOSIS — R252 Cramp and spasm: Secondary | ICD-10-CM

## 2024-08-26 DIAGNOSIS — R293 Abnormal posture: Secondary | ICD-10-CM

## 2024-08-26 DIAGNOSIS — R2689 Other abnormalities of gait and mobility: Secondary | ICD-10-CM | POA: Diagnosis not present

## 2024-08-26 DIAGNOSIS — Z9181 History of falling: Secondary | ICD-10-CM

## 2024-08-26 DIAGNOSIS — R262 Difficulty in walking, not elsewhere classified: Secondary | ICD-10-CM | POA: Diagnosis not present

## 2024-08-26 DIAGNOSIS — M6281 Muscle weakness (generalized): Secondary | ICD-10-CM

## 2024-08-26 NOTE — Therapy (Signed)
 OUTPATIENT PHYSICAL THERAPY LOWER EXTREMITY TREATMENT   Patient Name: Taylor Holloway MRN: [id number] DOB:November 26, 1960, 63 y.o., female Today's Date: 08/26/2024  END OF SESSION:  PT End of Session - 08/26/24 0803     Visit Number 9    Date for Recertification  09/09/24    Authorization Type [employer]    Progress Note Due on Visit 10    PT Start Time 0803    PT Stop Time 0845    PT Time Calculation (min) 42 min    Activity Tolerance Patient tolerated treatment well    Behavior During Therapy WFL for tasks assessed/performed             Past Medical History:  Diagnosis Date   GERD (gastroesophageal reflux disease)    No pertinent past medical history    PONV (postoperative nausea and vomiting)    Past Surgical History:  Procedure Laterality Date   ABDOMINAL HYSTERECTOMY  11/13/2011   Procedure: HYSTERECTOMY ABDOMINAL;  Surgeon: Charlie CHRISTELLA Croak, MD;  Location: [facility];  Service: Gynecology;  Laterality: Left;  abdominal hysterectomy with left salpingo-ophoorectomy   ANTERIOR AND POSTERIOR REPAIR  11/13/2011   Procedure: ANTERIOR (CYSTOCELE) AND POSTERIOR REPAIR (RECTOCELE);  Surgeon: Glendia DELENA Elizabeth, MD;  Location: [facility];  Service: Urology;  Laterality: N/A;   DIAGNOSTIC LAPAROSCOPY  1990   LAPAROSCOPIC ASSISTED VAGINAL HYSTERECTOMY  11/13/2011   Procedure: LAPAROSCOPIC ASSISTED VAGINAL HYSTERECTOMY;  Surgeon: Charlie CHRISTELLA Croak, MD;  Location: [facility];  Service: Gynecology;  Laterality: N/A;  attempted   PUBOVAGINAL SLING  11/13/2011   Procedure: CARLOYN GLADE;  Surgeon: Glendia DELENA Elizabeth, MD;  Location: [facility];  Service: Urology;  Laterality: N/A;  sparc sling   Patient Active Problem List   Diagnosis Date Noted   Cough 11/09/2013   Pneumonia, organism unspecified(486) 11/09/2013   GERD 03/26/2009    PCP: Dr. Verena  REFERRING PROVIDER: Tobie Tonita POUR, DO  REFERRING DIAG: R29.2 (ICD-10-CM) - Hyperreflexia R26.81 (ICD-10-CM) - Unsteady gait R25.8 (ICD-10-CM) -  Bradykinesia  THERAPY DIAG:  Muscle weakness (generalized)  Difficulty in walking, not elsewhere classified  History of falling  Other abnormalities of gait and mobility  Cramp and spasm  Abnormal posture  Rationale for Evaluation and Treatment: Rehabilitation  ONSET DATE: 07/11/2024  SUBJECTIVE:   SUBJECTIVE STATEMENT: Doing good.  No problems or falls.  I still seem to scuff my foot occasionally but have not lost my balance.    Initial eval: I had 4 falls within 6 months, 2 within weeks of one another.  I went to my primary doctor and they have done multiple studies with no abnormal findings.  Sent her for neuro consult.  No significant findings.  I am retired,  I go workout 3 times per week, we go out and about for various things.  No family close by.  We do travel some.  We were recently on a trip and were walking down a sidewalk and I fell when I caught my foot on the seam in the sidewalk.  I would like to work on my balance and walking to avoid any further falls.    PERTINENT HISTORY: na PAIN:  08/23/24 Are you having pain? No  PRECAUTIONS: Fall  RED FLAGS: None   WEIGHT BEARING RESTRICTIONS: No  FALLS:  Has patient fallen in last 6 months? Yes. Number of falls 4  LIVING ENVIRONMENT: Lives with: lives with their spouse Lives in: House/apartment Stairs: Yes: Internal: 16 steps; on right going up and External:  5 steps; on right going up Has following equipment at home: None  OCCUPATION: Retired Engineer, civil (consulting)  PLOF: Independent, Independent with basic ADLs, Independent with household mobility without device, Independent with community mobility without device, Independent with homemaking with ambulation, Independent with gait, and Independent with transfers  PATIENT GOALS: I would like to work on my balance and walking to avoid any further falls.    NEXT MD VISIT: prn  OBJECTIVE:  Note: Objective measures were completed at Evaluation unless otherwise  noted.  DIAGNOSTIC FINDINGS: all diagnostics normal  COGNITION: Overall cognitive status: Within functional limits for tasks assessed     SENSATION: WFL  MUSCLE LENGTH: Hamstrings: Right 55 deg; Left 60 deg Thomas test: Right pos; Left pos  POSTURE: No Significant postural limitations  PALPATION: Slight lumbar scoliosis noted: trunk shift to right  LOWER EXTREMITY ROM:  WNL  LOWER EXTREMITY MMT:  Generally 4+/5 with exception of hip abduction : right 4-/5, left 4/5,  hip ER right 3+/5, left 4-/5, hip extension 4-/5 bilaterally   FUNCTIONAL TESTS:  5 times sit to stand: 13.27 sec Timed up and go (TUG): 7.55 sec  GAIT: Distance walked: 100 feet Assistive device utilized: None Level of assistance: Complete Independence Comments: Mild trendelenburg but good step length and heel strike  MINI-BESTest: Balance Evaluation Systems Test ANTICIPATORY: SIT TO STAND: (2) normal without use of hands and stabilizes independently     (1) Moderate comes to stand WITH use of hands on 1st attempt    (0) Severe: unable to stand up from chair without assistance OR needs several attempts with use of   hands Score: 2  2. RISE TO TOES: feet shoulder width apart. Hands on hips.  Rise as high as you can onto your toes. Try to hold this pose for 3 sec                          (2) stable for 3s with maximum height    (1) heels up, but not full range (smaller than when holding hands) OR noticeable instability for 3 sec                           (0) < or equat to 3 s  Score: 2  3. STAND ON ONE LEG: look straight ahead. Hands on hips. Lift your leg off the ground.     Left:  trial 1 __17__ trial 2_ 8__                                    Right: Trial 1:___20__   Trial 2:__20_____   (2) 20 s       (2)  20 s                (1) < 20      (1) < 20 s   (0) Unable      (0) unable Score (use the worst side):1  REACTIVE POSTURAL CONTROL 4. COMPENSATORY STEPPING CORRECTION FORWARD stand with feet  shoulder width apart, arms at your sides. Lean forward against my hands beyond your forward limits.  When I let go, do whatever is necessary, including taking a step, to avoid a fall   (2) recovers independently  with a single, large step, to avoid a fall   (1) more than one step used to recover equilibrium   (  0) No step OR would fall if not caught Score: 1  5. COMPENSATORY STEPPING CORRECTION BACKWARD: Lean backward against my hands beyond your backward limits.  When I let go, do whatever is necessary, including a step to avoid a fall. (2) recovers independently  with a single, large step, to avoid a fall   (1) more than one step used to recover equilibrium   (0) No step OR would fall if not caught Score: 1  6. COMPENSATORY STEPPING LATERAL: Lean into my hand beyond your sideways limit.  When I let go, do whatever is necessary, including taking a step, to avoid a fall. Left           Right   (2) recovers independently with 1 step (crossover or lateral OK)   (1) several steps to recover equilibrium   (0) falls or cannot step Score: (use the side with the lowest score) 1  SENSORY ORIENTATION 7. STANCE FEET TOGETHER EYES OPEN  firm surface Be as stable and still as possible until I say stop   (2) 30s   (1) < 30 s   (0) unable Score: 2  8. STANCE ON FOAM EYES CLOSED feet together, eyes closed, hands on hips. Be as stable and still as you can until I stay stop.  I will start timing when you close your eyes. Time in seconds:      (2) 30s   (1) <30 s   (0) unable Score: 2  9. INCLINE EYES CLOSED: Stand on incline with feet shoulder width apart, arms down at your sides.  I will start timing when you close your eyes   (2) Stands 30 s and aligns with gravity   (1) stands < 30 s OR aligns with surface   (0) unable Score:  2  DYNAMIC GAIT 10. CHANGE IN GAIT SPEED: begin walking at your normal speed, when I tell you fast, walk as fast as you can, when I say slow walk very  slowly   (2) significantly changes walking speed without imbalance   (1) unable to change walking speed or signs of imbalance   (0) unable to achieve significant change in walking speed AND signes of imbalance Score:2  11. WALK WITH HEAD TURNS HORIZONTAL: begin walking at normal speed, when I say right, turn your head to the right, when I say left turn your head to the left.  Try to keep yourself walking in a straight line   (2) performs head turns with no change in gait speed and good balance   (1) performs head turns with reduction in gait speed   (0)  performs head turns with imbalance Score:2  12. WALK WITH PIVOT TURNS: begin walking at your normal speed.  When I tell you to turn and stop, turn as quickly as you can, face the opposite direction and stop.  After the turn, your feet should be close together   (2) turns with feet close FAST in 3 steps with good balance   (1) turns with feet close SLOW > 4 steps with good balance   (0) cannot turn with feet close at any speed without imbalance Score:2  13. STEP OVER OBSTACLES:  ( 9 inch height 10 feet away from subject) begin walking at your normal speed.  When you get to the box, step over it, not around it and keep walking   (2) Able to step over box with minimal change of gait speed and good balance   (1) Steps  over box but touches box OR displays cautious behavior by slowing gait   (0) Unable to step over box OR steps around box Score: 2  14. TIMED UP AND GO NORMAL AND WITH DUAL TASK: 3 METER WALK: When I say go walk at your your normal speed across the tape, turn around and come back to sit in the chair   Time: Count backwards by 3s starting at    20.  When I say go, stand up from the chair, walk at your normal speed across the tape on the floor, turn around, come back to sit in the chair.  Continue counting backwards the entire time.  Time:   (2) No noticeable change in sitting, standing or walking while backward counting when  compared to TUG without dual task   (1) Dual task affects either counting OR walking (>10%)    (0) stops counting while walking OR stops walking while counting If gait speed slows more than 10% between TUG without and with dual task, the score should be decreased by a point Score: 1  Total score:         23  /28                                                                                                                                    TREATMENT DATE:  08/26/2024 Nustep x 5 min L 5 (PT present to discuss status) BOSU squats 2 x 10 Star drill eyes closed with slider x 5 each LE SLS with eyes closed 5 x each LE attempting 10 sec hold Cone touches 3 x 10 each LE eyes open Standing in tandem on balance pad dead lift x 10 each LE (no weight) eyes closed Marching on balance pad 2 x 10 high knees with eyes closed Rocker board x 2 min without UE support Lunges to BOSU x 10 each LE fwd and x 10 each LE lateral  08/23/2024 Nustep x 5 min L 5 (PT present to discuss status) Mini BEST as above Clock Yourself: simple clock 50 SPM 3 min Clock Yourself Brain Games months of the year 40 spm 3 min Walking with 10# weight unilateral with cognitive dual tasks Walking with pair of 10# weights (farmers hold and shoulder hold) with dual cog tasks Box stepping forward, lateral and backward  08/17/2024 Nustep x 5 min L 5 (PT present to discuss status) Slider star drill (5 points) x 5 -6each LE (with mirror for visual feedback to maintain proper alignment) Eccentric step downs on 4 step fwd x 10 then lateral x 10 (with mirror for visual feedback to maintain proper alignment) Lateral band walks x 3 laps with yellow tband tied (10 step each way, vc's for keeping tension in the band) Static lunge x 10 B at mirror Supine bridging + hip ABD x 20 with red band, then 10 with red loop S/L clam red loop x 20  B S/L hip ABD red loop x 20 B Supine hamstring curl in bridge position: heels on 2 separate  towels: out with both feet then pull one in at a time x 2 total cramped today She could bridge with S/L sllde outs x 5 R Bridge +HS curl on red plyo ball x 5 HS stretch with strap x 30 sec B Farmer carry 10# unilaterally at side and shoulder B one lap of hallway ea. March with prolonged hold 10#KB unilaterally x 10 B   PATIENT EDUCATION:  Education details: See above Person educated: Patient Education method: Explanation, Demonstration, Verbal cues, and Handouts Education comprehension: verbalized understanding, returned demonstration, and verbal cues required  HOME EXERCISE PROGRAM: Access Code: [email or web address] URL: [email or web address] Date: 07/29/2024 Prepared by: Grayce Sheldon  Exercises - Side Stepping with Resistance at Feet  - 1 x daily - 3 x weekly - 3 sets - 10 reps - Forward Monster Walks  - 1 x daily - 3 x weekly - 3 sets - 10 reps - Seated Hip External Rotation with Resistance  - 1 x daily - 7 x weekly - 1-2 sets - 10 reps - Standing Hip Extension with Resistance at Ankles and Counter Support  - 1 x daily - 7 x weekly - 1-2 sets - 10 reps - Standing Hip Flexion with Anchored Resistance and Chair Support  - 1 x daily - 7 x weekly - 1-2 sets - 10 reps - Standing Hip Abduction Kicks  - 1 x daily - 7 x weekly - 1-2 sets - 10 reps - Supine Hip External Rotation Stretch  - 1 x daily - 7 x weekly - 1 sets - 3 reps - 20-30 hold Access Code: [email or web address] URL: [email or web address] Date: 07/22/2024 Prepared by: Mliss  Exercises - Standing Hamstring Stretch on Chair  - 1 x daily - 7 x weekly - 1 sets - 3 reps - 30 sec hold - Quadricep Stretch with Chair and Counter Support  - 1 x daily - 7 x weekly - 1 sets - 3 reps - 30 sec hold - Seated Figure 4 Piriformis Stretch  - 1 x daily - 7 x weekly - 1 sets - 3 reps - 30 sed hold - Seated Lateral Trunk Stretch on Swiss Ball  - 1 x daily - 7 x weekly - 1 sets - 10 reps - 5 sec hold - Clamshell with Resistance  - 1 x  daily - 3 x weekly - 2-3 sets - 10 reps - Standing Isometric Hip Abduction with Ball on Wall  - 1 x daily - 3 x weekly - 2 sets - 10 reps - 5 sec hold - Hip Hiking on Step  - 1 x daily - 3 x weekly - 2 sets - 10 reps - Wall Squat  - 1 x daily - 3 x weekly - 2 sets - 10 reps  ASSESSMENT:  CLINICAL IMPRESSION: Taylor Holloway is progressing very well.  She is tolerating high level balance tasks with minimal lob.  We added balance tasks with eyes closed on uneven surfaces today as well as BOSU squats.  These were a bit more challenging but she had only minor lob with good protective responses.  We discussed a right foot issue that she has had for years for which she will be seeing Dr. Kit for.  She had been seeing a podiatrist.  The podiatrist suggested she needed a pretty extensive surgery.  She will keep us  posted on when she sees Dr. Kit.  She would benefit from continuing skilled PT to meet goals stated below.     OBJECTIVE IMPAIRMENTS: Abnormal gait, decreased balance, difficulty walking, decreased strength, increased muscle spasms, impaired flexibility, postural dysfunction, and pain.   ACTIVITY LIMITATIONS: stairs, transfers, and bed mobility  PARTICIPATION LIMITATIONS: community activity and yard work  PERSONAL FACTORS: No significant other co-morbidities are also affecting patient's functional outcome.   REHAB POTENTIAL: Excellent  CLINICAL DECISION MAKING: Stable/uncomplicated  EVALUATION COMPLEXITY: Low   GOALS: Goals reviewed with patient? Yes  SHORT TERM GOALS: Target date: 08/13/2024  Patient will be independent with initial HEP  Baseline: Goal status: MET 08/05/24  2.  Eliminate right low back pain Baseline:  Goal status: MET 08/05/24   LONG TERM GOALS: Target date: 09/10/2024  Patient to be independent with advanced HEP  Baseline:  Goal status: MET 08/26/24  2.  No falls during episode of skilled PT Baseline:  Goal status: In progress  3.  Eliminate  trendelenburg gait Baseline:  Goal status: MET 08/26/24  4.  Patient to be able to demonstrate SLS on each LE x 20 sec without LOB Baseline:  Goal status: In progress  5.  Patient to demonstrate 30 sec on each task of modified CTSIB with no LOB Baseline:  Goal status: In progress    PLAN:  PT FREQUENCY: 1-2x/week  PT DURATION: 8 weeks  PLANNED INTERVENTIONS: [id number]-Therapeutic exercises, 97530- Therapeutic activity, [id number]- Neuromuscular re-education, 97535- Self Care, [id number]- Manual therapy, [phone]- Gait training, [phone]- Canalith repositioning, [id number]- Aquatic Therapy, [phone]- Electrical stimulation (unattended), [phone]- Electrical stimulation (manual), [id number]- Vasopneumatic device, 97035- Ultrasound, Patient/Family education, Balance training, Stair training, Joint mobilization, Spinal mobilization, Vestibular training, DME instructions, Cryotherapy, and Moist heat  PLAN FOR NEXT SESSION: Continue LE and proximal hip concentrated strengthening, eyes closed balance training particularly reactive postural control and dual tasking   Albert Devaul B. Dejanae Helser, PT 08/26/24 9:06 AM [facility] [address] [place], [place] [address] Phone # [phone] Fax [phone]

## 2024-09-01 ENCOUNTER — Encounter: Payer: Self-pay | Admitting: Physical Therapy

## 2024-09-01 ENCOUNTER — Ambulatory Visit: Admitting: Physical Therapy

## 2024-09-01 DIAGNOSIS — R262 Difficulty in walking, not elsewhere classified: Secondary | ICD-10-CM

## 2024-09-01 DIAGNOSIS — R252 Cramp and spasm: Secondary | ICD-10-CM | POA: Diagnosis not present

## 2024-09-01 DIAGNOSIS — M6281 Muscle weakness (generalized): Secondary | ICD-10-CM

## 2024-09-01 DIAGNOSIS — Z9181 History of falling: Secondary | ICD-10-CM

## 2024-09-01 DIAGNOSIS — R293 Abnormal posture: Secondary | ICD-10-CM | POA: Diagnosis not present

## 2024-09-01 DIAGNOSIS — R2689 Other abnormalities of gait and mobility: Secondary | ICD-10-CM | POA: Diagnosis not present

## 2024-09-01 NOTE — Therapy (Signed)
 OUTPATIENT PHYSICAL THERAPY LOWER EXTREMITY TREATMENT   Patient Name: Taylor Holloway MRN: [id number] DOB:28-May-1961, 63 y.o., female Today's Date: 09/01/2024  END OF SESSION:  PT End of Session - 09/01/24 1618     Visit Number 10    Date for Recertification  09/09/24    Authorization Type BC[employer]    PT Start Time 1616    PT Stop Time 1655    PT Time Calculation (min) 39 min    Activity Tolerance Patient tolerated treatment well             Past Medical History:  Diagnosis Date   GERD (gastroesophageal reflux disease)    No pertinent past medical history    PONV (postoperative nausea and vomiting)    Past Surgical History:  Procedure Laterality Date   ABDOMINAL HYSTERECTOMY  11/13/2011   Procedure: HYSTERECTOMY ABDOMINAL;  Surgeon: Charlie CHRISTELLA Croak, MD;  Location: [facility];  Service: Gynecology;  Laterality: Left;  abdominal hysterectomy with left salpingo-ophoorectomy   ANTERIOR AND POSTERIOR REPAIR  11/13/2011   Procedure: ANTERIOR (CYSTOCELE) AND POSTERIOR REPAIR (RECTOCELE);  Surgeon: Glendia DELENA Elizabeth, MD;  Location: [facility];  Service: Urology;  Laterality: N/A;   DIAGNOSTIC LAPAROSCOPY  1990   LAPAROSCOPIC ASSISTED VAGINAL HYSTERECTOMY  11/13/2011   Procedure: LAPAROSCOPIC ASSISTED VAGINAL HYSTERECTOMY;  Surgeon: Charlie CHRISTELLA Croak, MD;  Location: [facility];  Service: Gynecology;  Laterality: N/A;  attempted   PUBOVAGINAL SLING  11/13/2011   Procedure: CARLOYN GLADE;  Surgeon: Glendia DELENA Elizabeth, MD;  Location: [facility];  Service: Urology;  Laterality: N/A;  sparc sling   Patient Active Problem List   Diagnosis Date Noted   Cough 11/09/2013   Pneumonia, organism unspecified(486) 11/09/2013   GERD 03/26/2009    PCP: Dr. Verena  REFERRING PROVIDER: Tobie Tonita POUR, DO  REFERRING DIAG: R29.2 (ICD-10-CM) - Hyperreflexia R26.81 (ICD-10-CM) - Unsteady gait R25.8 (ICD-10-CM) - Bradykinesia  THERAPY DIAG:  Muscle weakness (generalized)  Difficulty in walking, not  elsewhere classified  History of falling  Rationale for Evaluation and Treatment: Rehabilitation  ONSET DATE: 07/11/2024  SUBJECTIVE:   SUBJECTIVE STATEMENT: My hubby still says I limp a little when I'm tired.  I made an appt about my bunion.    Initial eval: I had 4 falls within 6 months, 2 within weeks of one another.  I went to my primary doctor and they have done multiple studies with no abnormal findings.  Sent her for neuro consult.  No significant findings.  I am retired,  I go workout 3 times per week, we go out and about for various things.  No family close by.  We do travel some.  We were recently on a trip and were walking down a sidewalk and I fell when I caught my foot on the seam in the sidewalk.  I would like to work on my balance and walking to avoid any further falls.    PERTINENT HISTORY: na PAIN:   Are you having pain? No  PRECAUTIONS: Fall  RED FLAGS: None   WEIGHT BEARING RESTRICTIONS: No  FALLS:  Has patient fallen in last 6 months? Yes. Number of falls 4  LIVING ENVIRONMENT: Lives with: lives with their spouse Lives in: House/apartment Stairs: Yes: Internal: 16 steps; on right going up and External: 5 steps; on right going up Has following equipment at home: None  OCCUPATION: Retired engineer, civil (consulting)  PLOF: Independent, Independent with basic ADLs, Independent with household mobility without device, Independent with community mobility without device, Independent  with homemaking with ambulation, Independent with gait, and Independent with transfers  PATIENT GOALS: I would like to work on my balance and walking to avoid any further falls.    NEXT MD VISIT: prn  OBJECTIVE:  Note: Objective measures were completed at Evaluation unless otherwise noted.  DIAGNOSTIC FINDINGS: all diagnostics normal  COGNITION: Overall cognitive status: Within functional limits for tasks assessed     SENSATION: WFL  MUSCLE LENGTH: Hamstrings: Right 55 deg; Left 60  deg Thomas test: Right pos; Left pos  POSTURE: No Significant postural limitations  PALPATION: Slight lumbar scoliosis noted: trunk shift to right  LOWER EXTREMITY ROM:  WNL  LOWER EXTREMITY MMT:  Generally 4+/5 with exception of hip abduction : right 4-/5, left 4/5,  hip ER right 3+/5, left 4-/5, hip extension 4-/5 bilaterally   FUNCTIONAL TESTS:  5 times sit to stand: 13.27 sec Timed up and go (TUG): 7.55 sec  GAIT: Distance walked: 100 feet Assistive device utilized: None Level of assistance: Complete Independence Comments: Mild trendelenburg but good step length and heel strike  MINI-BESTest: Balance Evaluation Systems Test ANTICIPATORY: SIT TO STAND: (2) normal without use of hands and stabilizes independently     (1) Moderate comes to stand WITH use of hands on 1st attempt    (0) Severe: unable to stand up from chair without assistance OR needs several attempts with use of   hands Score: 2  2. RISE TO TOES: feet shoulder width apart. Hands on hips.  Rise as high as you can onto your toes. Try to hold this pose for 3 sec                          (2) stable for 3s with maximum height    (1) heels up, but not full range (smaller than when holding hands) OR noticeable instability for 3 sec                           (0) < or equat to 3 s  Score: 2  3. STAND ON ONE LEG: look straight ahead. Hands on hips. Lift your leg off the ground.     Left:  trial 1 __17__ trial 2_ 8__                                    Right: Trial 1:___20__   Trial 2:__20_____   (2) 20 s       (2)  20 s                (1) < 20      (1) < 20 s   (0) Unable      (0) unable Score (use the worst side):1  REACTIVE POSTURAL CONTROL 4. COMPENSATORY STEPPING CORRECTION FORWARD stand with feet shoulder width apart, arms at your sides. Lean forward against my hands beyond your forward limits.  When I let go, do whatever is necessary, including taking a step, to avoid a fall   (2) recovers independently   with a single, large step, to avoid a fall   (1) more than one step used to recover equilibrium   (0) No step OR would fall if not caught Score: 1  5. COMPENSATORY STEPPING CORRECTION BACKWARD: Lean backward against my hands beyond your backward limits.  When I let go, do whatever is  necessary, including a step to avoid a fall. (2) recovers independently  with a single, large step, to avoid a fall   (1) more than one step used to recover equilibrium   (0) No step OR would fall if not caught Score: 1  6. COMPENSATORY STEPPING LATERAL: Lean into my hand beyond your sideways limit.  When I let go, do whatever is necessary, including taking a step, to avoid a fall. Left           Right   (2) recovers independently with 1 step (crossover or lateral OK)   (1) several steps to recover equilibrium   (0) falls or cannot step Score: (use the side with the lowest score) 1  SENSORY ORIENTATION 7. STANCE FEET TOGETHER EYES OPEN  firm surface Be as stable and still as possible until I say stop   (2) 30s   (1) < 30 s   (0) unable Score: 2  8. STANCE ON FOAM EYES CLOSED feet together, eyes closed, hands on hips. Be as stable and still as you can until I stay stop.  I will start timing when you close your eyes. Time in seconds:      (2) 30s   (1) <30 s   (0) unable Score: 2  9. INCLINE EYES CLOSED: Stand on incline with feet shoulder width apart, arms down at your sides.  I will start timing when you close your eyes   (2) Stands 30 s and aligns with gravity   (1) stands < 30 s OR aligns with surface   (0) unable Score:  2  DYNAMIC GAIT 10. CHANGE IN GAIT SPEED: begin walking at your normal speed, when I tell you fast, walk as fast as you can, when I say slow walk very slowly   (2) significantly changes walking speed without imbalance   (1) unable to change walking speed or signs of imbalance   (0) unable to achieve significant change in walking speed AND signes of  imbalance Score:2  11. WALK WITH HEAD TURNS HORIZONTAL: begin walking at normal speed, when I say right, turn your head to the right, when I say left turn your head to the left.  Try to keep yourself walking in a straight line   (2) performs head turns with no change in gait speed and good balance   (1) performs head turns with reduction in gait speed   (0)  performs head turns with imbalance Score:2  12. WALK WITH PIVOT TURNS: begin walking at your normal speed.  When I tell you to turn and stop, turn as quickly as you can, face the opposite direction and stop.  After the turn, your feet should be close together   (2) turns with feet close FAST in 3 steps with good balance   (1) turns with feet close SLOW > 4 steps with good balance   (0) cannot turn with feet close at any speed without imbalance Score:2  13. STEP OVER OBSTACLES:  ( 9 inch height 10 feet away from subject) begin walking at your normal speed.  When you get to the box, step over it, not around it and keep walking   (2) Able to step over box with minimal change of gait speed and good balance   (1) Steps over box but touches box OR displays cautious behavior by slowing gait   (0) Unable to step over box OR steps around box Score: 2  14. TIMED UP AND GO NORMAL AND  WITH DUAL TASK: 3 METER WALK: When I say go walk at your your normal speed across the tape, turn around and come back to sit in the chair   Time: Count backwards by 3s starting at    20.  When I say go, stand up from the chair, walk at your normal speed across the tape on the floor, turn around, come back to sit in the chair.  Continue counting backwards the entire time.  Time:   (2) No noticeable change in sitting, standing or walking while backward counting when compared to TUG without dual task   (1) Dual task affects either counting OR walking (>10%)    (0) stops counting while walking OR stops walking while counting If gait speed slows more than 10% between  TUG without and with dual task, the score should be decreased by a point Score: 1  Total score:         23  /28                                                                                                                                    TREATMENT DATE:  09/01/2024 Nustep x 5 min L 5 (PT present to discuss status) Heel and toe raises 10x Weight shifts 4 ways 10x with eyes closed 5x Walking on toes, walking on heels, tip toe with overhead reaches Hurdles walk forward and lateral and backward Walk with ball bounce Walk with ball bounce against the wall  Obstacle course stepping on and over objects with dual cognitive tasks Clock Yourself coordination movements with 2# UE weights 3 minutes 40 spm Discussed exercise plan following PT (she is a member of [employer]) 08/26/2024 Nustep x 5 min L 5 (PT present to discuss status) BOSU squats 2 x 10 Star drill eyes closed with slider x 5 each LE SLS with eyes closed 5 x each LE attempting 10 sec hold Cone touches 3 x 10 each LE eyes open Standing in tandem on balance pad dead lift x 10 each LE (no weight) eyes closed Marching on balance pad 2 x 10 high knees with eyes closed Rocker board x 2 min without UE support Lunges to BOSU x 10 each LE fwd and x 10 each LE lateral  08/23/2024 Nustep x 5 min L 5 (PT present to discuss status) Mini BEST as above Clock Yourself: simple clock 50 SPM 3 min Clock Yourself Brain Games months of the year 40 spm 3 min Walking with 10# weight unilateral with cognitive dual tasks Walking with pair of 10# weights (farmers hold and shoulder hold) with dual cog tasks Box stepping forward, lateral and backward  08/17/2024 Nustep x 5 min L 5 (PT present to discuss status) Slider star drill (5 points) x 5 -6each LE (with mirror for visual feedback to maintain proper alignment) Eccentric step downs on 4 step fwd x 10 then lateral x 10 (with  mirror for visual feedback to maintain proper  alignment) Lateral band walks x 3 laps with yellow tband tied (10 step each way, vc's for keeping tension in the band) Static lunge x 10 B at mirror Supine bridging + hip ABD x 20 with red band, then 10 with red loop S/L clam red loop x 20 B S/L hip ABD red loop x 20 B Supine hamstring curl in bridge position: heels on 2 separate towels: out with both feet then pull one in at a time x 2 total cramped today She could bridge with S/L sllde outs x 5 R Bridge +HS curl on red plyo ball x 5 HS stretch with strap x 30 sec B Farmer carry 10# unilaterally at side and shoulder B one lap of hallway ea. March with prolonged hold 10#KB unilaterally x 10 B   PATIENT EDUCATION:  Education details: See above Person educated: Patient Education method: Explanation, Demonstration, Verbal cues, and Handouts Education comprehension: verbalized understanding, returned demonstration, and verbal cues required  HOME EXERCISE PROGRAM: Access Code: [email or web address] URL: [email or web address] Date: 07/29/2024 Prepared by: Grayce Sheldon  Exercises - Side Stepping with Resistance at Feet  - 1 x daily - 3 x weekly - 3 sets - 10 reps - Forward Monster Walks  - 1 x daily - 3 x weekly - 3 sets - 10 reps - Seated Hip External Rotation with Resistance  - 1 x daily - 7 x weekly - 1-2 sets - 10 reps - Standing Hip Extension with Resistance at Ankles and Counter Support  - 1 x daily - 7 x weekly - 1-2 sets - 10 reps - Standing Hip Flexion with Anchored Resistance and Chair Support  - 1 x daily - 7 x weekly - 1-2 sets - 10 reps - Standing Hip Abduction Kicks  - 1 x daily - 7 x weekly - 1-2 sets - 10 reps - Supine Hip External Rotation Stretch  - 1 x daily - 7 x weekly - 1 sets - 3 reps - 20-30 hold Access Code: [email or web address] URL: [email or web address] Date: 07/22/2024 Prepared by: Mliss  Exercises - Standing Hamstring Stretch on Chair  - 1 x daily - 7 x weekly - 1 sets - 3 reps - 30 sec hold -  Quadricep Stretch with Chair and Counter Support  - 1 x daily - 7 x weekly - 1 sets - 3 reps - 30 sec hold - Seated Figure 4 Piriformis Stretch  - 1 x daily - 7 x weekly - 1 sets - 3 reps - 30 sed hold - Seated Lateral Trunk Stretch on Swiss Ball  - 1 x daily - 7 x weekly - 1 sets - 10 reps - 5 sec hold - Clamshell with Resistance  - 1 x daily - 3 x weekly - 2-3 sets - 10 reps - Standing Isometric Hip Abduction with Ball on Wall  - 1 x daily - 3 x weekly - 2 sets - 10 reps - 5 sec hold - Hip Hiking on Step  - 1 x daily - 3 x weekly - 2 sets - 10 reps - Wall Squat  - 1 x daily - 3 x weekly - 2 sets - 10 reps  ASSESSMENT:  CLINICAL IMPRESSION: Taylor Holloway is able to perform more advance balance challenges including eyes closed, backwards walking and movements with dual cognitive tasks.  CGA provided intermittently for safety however no loss of balance requiring physical assist to recover.  Her rating of perceived exertion rated as  very high end of session 8-9/10 although she states it was mentally challenging than physically.     OBJECTIVE IMPAIRMENTS: Abnormal gait, decreased balance, difficulty walking, decreased strength, increased muscle spasms, impaired flexibility, postural dysfunction, and pain.   ACTIVITY LIMITATIONS: stairs, transfers, and bed mobility  PARTICIPATION LIMITATIONS: community activity and yard work  PERSONAL FACTORS: No significant other co-morbidities are also affecting patient's functional outcome.   REHAB POTENTIAL: Excellent  CLINICAL DECISION MAKING: Stable/uncomplicated  EVALUATION COMPLEXITY: Low   GOALS: Goals reviewed with patient? Yes  SHORT TERM GOALS: Target date: 08/13/2024  Patient will be independent with initial HEP  Baseline: Goal status: MET 08/05/24  2.  Eliminate right low back pain Baseline:  Goal status: MET 08/05/24   LONG TERM GOALS: Target date: 09/10/2024  Patient to be independent with advanced HEP  Baseline:  Goal status: MET  08/26/24  2.  No falls during episode of skilled PT Baseline:  Goal status: In progress  3.  Eliminate trendelenburg gait Baseline:  Goal status: MET 08/26/24  4.  Patient to be able to demonstrate SLS on each LE x 20 sec without LOB Baseline:  Goal status: In progress  5.  Patient to demonstrate 30 sec on each task of modified CTSIB with no LOB Baseline:  Goal status: In progress    PLAN:  PT FREQUENCY: 1-2x/week  PT DURATION: 8 weeks  PLANNED INTERVENTIONS: 97110-Therapeutic exercises, 97530- Therapeutic activity, 97112- Neuromuscular re-education, [phone]- Self Care, [id number]- Manual therapy, [phone]- Gait training, [phone]- Canalith repositioning, [id number]- Aquatic Therapy, [phone]- Electrical stimulation (unattended), [phone]- Electrical stimulation (manual), [id number]- Vasopneumatic device, [id number]- Ultrasound, Patient/Family education, Balance training, Stair training, Joint mobilization, Spinal mobilization, Vestibular training, DME instructions, Cryotherapy, and Moist heat  PLAN FOR NEXT SESSION: Continue LE and proximal hip concentrated strengthening, eyes closed balance training particularly reactive postural control and dual tasking   Glade Pesa, PT 09/01/24 8:57 PM Phone: [phone] Fax: [phone]  [facility] [address] [place], [place] [address] Phone # [phone] Fax [phone]

## 2024-09-09 ENCOUNTER — Ambulatory Visit: Attending: Neurology | Admitting: Physical Therapy

## 2024-09-09 ENCOUNTER — Encounter: Payer: Self-pay | Admitting: Physical Therapy

## 2024-09-09 DIAGNOSIS — R2689 Other abnormalities of gait and mobility: Secondary | ICD-10-CM | POA: Insufficient documentation

## 2024-09-09 DIAGNOSIS — Z9181 History of falling: Secondary | ICD-10-CM | POA: Diagnosis not present

## 2024-09-09 DIAGNOSIS — M6281 Muscle weakness (generalized): Secondary | ICD-10-CM | POA: Insufficient documentation

## 2024-09-09 DIAGNOSIS — R262 Difficulty in walking, not elsewhere classified: Secondary | ICD-10-CM | POA: Insufficient documentation

## 2024-09-09 NOTE — Therapy (Signed)
 OUTPATIENT PHYSICAL THERAPY LOWER EXTREMITY TREATMENT   Patient Name: Taylor Holloway MRN: [id number] DOB:November 19, 1960, 63 y.o., female Today's Date: 09/09/2024  END OF SESSION:  PT End of Session - 09/09/24 0851     Visit Number 11    Date for Recertification  09/09/24    Authorization Type BCBS    Progress Note Due on Visit 10    PT Start Time 0848    PT Stop Time 0930    PT Time Calculation (min) 42 min    Activity Tolerance Patient tolerated treatment well    Behavior During Therapy WFL for tasks assessed/performed              Past Medical History:  Diagnosis Date   GERD (gastroesophageal reflux disease)    No pertinent past medical history    PONV (postoperative nausea and vomiting)    Past Surgical History:  Procedure Laterality Date   ABDOMINAL HYSTERECTOMY  11/13/2011   Procedure: HYSTERECTOMY ABDOMINAL;  Surgeon: Charlie CHRISTELLA Croak, MD;  Location: [facility];  Service: Gynecology;  Laterality: Left;  abdominal hysterectomy with left salpingo-ophoorectomy   ANTERIOR AND POSTERIOR REPAIR  11/13/2011   Procedure: ANTERIOR (CYSTOCELE) AND POSTERIOR REPAIR (RECTOCELE);  Surgeon: Glendia DELENA Elizabeth, MD;  Location: [facility];  Service: Urology;  Laterality: N/A;   DIAGNOSTIC LAPAROSCOPY  1990   LAPAROSCOPIC ASSISTED VAGINAL HYSTERECTOMY  11/13/2011   Procedure: LAPAROSCOPIC ASSISTED VAGINAL HYSTERECTOMY;  Surgeon: Charlie CHRISTELLA Croak, MD;  Location: [facility];  Service: Gynecology;  Laterality: N/A;  attempted   PUBOVAGINAL SLING  11/13/2011   Procedure: CARLOYN GLADE;  Surgeon: Glendia DELENA Elizabeth, MD;  Location: [facility];  Service: Urology;  Laterality: N/A;  sparc sling   Patient Active Problem List   Diagnosis Date Noted   Cough 11/09/2013   Pneumonia, organism unspecified(486) 11/09/2013   GERD 03/26/2009    PCP: Dr. Verena  REFERRING PROVIDER: Tobie Tonita POUR, DO  REFERRING DIAG: R29.2 (ICD-10-CM) - Hyperreflexia R26.81 (ICD-10-CM) - Unsteady gait R25.8 (ICD-10-CM)  - Bradykinesia  THERAPY DIAG:  Muscle weakness (generalized)  Difficulty in walking, not elsewhere classified  History of falling  Other abnormalities of gait and mobility  Rationale for Evaluation and Treatment: Rehabilitation  ONSET DATE: 07/11/2024  SUBJECTIVE:   SUBJECTIVE STATEMENT: I am feeling ready to be discharged.  I haven't had any further falls.   Initial eval: I had 4 falls within 6 months, 2 within weeks of one another.  I went to my primary doctor and they have done multiple studies with no abnormal findings.  Sent her for neuro consult.  No significant findings.  I am retired,  I go workout 3 times per week, we go out and about for various things.  No family close by.  We do travel some.  We were recently on a trip and were walking down a sidewalk and I fell when I caught my foot on the seam in the sidewalk.  I would like to work on my balance and walking to avoid any further falls.    PERTINENT HISTORY: na PAIN:   Are you having pain? No  PRECAUTIONS: Fall  RED FLAGS: None   WEIGHT BEARING RESTRICTIONS: No  FALLS:  Has patient fallen in last 6 months? Yes. Number of falls 4  LIVING ENVIRONMENT: Lives with: lives with their spouse Lives in: House/apartment Stairs: Yes: Internal: 16 steps; on right going up and External: 5 steps; on right going up Has following equipment at home: None  OCCUPATION: Retired  nurse  PLOF: Independent, Independent with basic ADLs, Independent with household mobility without device, Independent with community mobility without device, Independent with homemaking with ambulation, Independent with gait, and Independent with transfers  PATIENT GOALS: I would like to work on my balance and walking to avoid any further falls.    NEXT MD VISIT: prn  OBJECTIVE:  Note: Objective measures were completed at Evaluation unless otherwise noted.  DIAGNOSTIC FINDINGS: all diagnostics normal  COGNITION: Overall cognitive status:  Within functional limits for tasks assessed     SENSATION: WFL  MUSCLE LENGTH: Hamstrings: Right 55 deg; Left 60 deg Thomas test: Right pos; Left pos  POSTURE: No Significant postural limitations  PALPATION: Slight lumbar scoliosis noted: trunk shift to right  LOWER EXTREMITY ROM:  WNL  LOWER EXTREMITY MMT: 09/09/24: 5/5 bil LE   Generally 4+/5 with exception of hip abduction : right 4-/5, left 4/5,  hip ER right 3+/5, left 4-/5, hip extension 4-/5 bilaterally   FUNCTIONAL TESTS:  09/09/24 5xSTS: 10.16 TUG: 6.97 sec   5 times sit to stand: 13.27 sec Timed up and go (TUG): 7.55 sec  GAIT: Distance walked: 100 feet Assistive device utilized: None Level of assistance: Complete Independence Comments: Mild trendelenburg but good step length and heel strike  MINI-BESTest: Balance Evaluation Systems Test ANTICIPATORY: SIT TO STAND: (2) normal without use of hands and stabilizes independently     (1) Moderate comes to stand WITH use of hands on 1st attempt    (0) Severe: unable to stand up from chair without assistance OR needs several attempts with use of   hands Score: 2  2. RISE TO TOES: feet shoulder width apart. Hands on hips.  Rise as high as you can onto your toes. Try to hold this pose for 3 sec                          (2) stable for 3s with maximum height    (1) heels up, but not full range (smaller than when holding hands) OR noticeable instability for 3 sec                           (0) < or equat to 3 s  Score: 2  3. STAND ON ONE LEG: look straight ahead. Hands on hips. Lift your leg off the ground.     Left:  trial 1 __17__ trial 2_ 8__                                    Right: Trial 1:___20__   Trial 2:__20_____   (2) 20 s       (2)  20 s                (1) < 20      (1) < 20 s   (0) Unable      (0) unable Score (use the worst side):1  REACTIVE POSTURAL CONTROL 4. COMPENSATORY STEPPING CORRECTION FORWARD stand with feet shoulder width apart, arms at  your sides. Lean forward against my hands beyond your forward limits.  When I let go, do whatever is necessary, including taking a step, to avoid a fall   (2) recovers independently  with a single, large step, to avoid a fall   (1) more than one step used to recover equilibrium   (  0) No step OR would fall if not caught Score: 1  5. COMPENSATORY STEPPING CORRECTION BACKWARD: Lean backward against my hands beyond your backward limits.  When I let go, do whatever is necessary, including a step to avoid a fall. (2) recovers independently  with a single, large step, to avoid a fall   (1) more than one step used to recover equilibrium   (0) No step OR would fall if not caught Score: 1  6. COMPENSATORY STEPPING LATERAL: Lean into my hand beyond your sideways limit.  When I let go, do whatever is necessary, including taking a step, to avoid a fall. Left           Right   (2) recovers independently with 1 step (crossover or lateral OK)   (1) several steps to recover equilibrium   (0) falls or cannot step Score: (use the side with the lowest score) 1  SENSORY ORIENTATION 7. STANCE FEET TOGETHER EYES OPEN  firm surface Be as stable and still as possible until I say stop   (2) 30s   (1) < 30 s   (0) unable Score: 2  8. STANCE ON FOAM EYES CLOSED feet together, eyes closed, hands on hips. Be as stable and still as you can until I stay stop.  I will start timing when you close your eyes. Time in seconds:      (2) 30s   (1) <30 s   (0) unable Score: 2  9. INCLINE EYES CLOSED: Stand on incline with feet shoulder width apart, arms down at your sides.  I will start timing when you close your eyes   (2) Stands 30 s and aligns with gravity   (1) stands < 30 s OR aligns with surface   (0) unable Score:  2  DYNAMIC GAIT 10. CHANGE IN GAIT SPEED: begin walking at your normal speed, when I tell you fast, walk as fast as you can, when I say slow walk very slowly   (2) significantly changes  walking speed without imbalance   (1) unable to change walking speed or signs of imbalance   (0) unable to achieve significant change in walking speed AND signes of imbalance Score:2  11. WALK WITH HEAD TURNS HORIZONTAL: begin walking at normal speed, when I say right, turn your head to the right, when I say left turn your head to the left.  Try to keep yourself walking in a straight line   (2) performs head turns with no change in gait speed and good balance   (1) performs head turns with reduction in gait speed   (0)  performs head turns with imbalance Score:2  12. WALK WITH PIVOT TURNS: begin walking at your normal speed.  When I tell you to turn and stop, turn as quickly as you can, face the opposite direction and stop.  After the turn, your feet should be close together   (2) turns with feet close FAST in 3 steps with good balance   (1) turns with feet close SLOW > 4 steps with good balance   (0) cannot turn with feet close at any speed without imbalance Score:2  13. STEP OVER OBSTACLES:  ( 9 inch height 10 feet away from subject) begin walking at your normal speed.  When you get to the box, step over it, not around it and keep walking   (2) Able to step over box with minimal change of gait speed and good balance   (1) Steps  over box but touches box OR displays cautious behavior by slowing gait   (0) Unable to step over box OR steps around box Score: 2  14. TIMED UP AND GO NORMAL AND WITH DUAL TASK: 3 METER WALK: When I say go walk at your your normal speed across the tape, turn around and come back to sit in the chair   Time: Count backwards by 3s starting at    20.  When I say go, stand up from the chair, walk at your normal speed across the tape on the floor, turn around, come back to sit in the chair.  Continue counting backwards the entire time.  Time:   (2) No noticeable change in sitting, standing or walking while backward counting when compared to TUG without dual  task   (1) Dual task affects either counting OR walking (>10%)    (0) stops counting while walking OR stops walking while counting If gait speed slows more than 10% between TUG without and with dual task, the score should be decreased by a point Score: 1  Total score:         23  /28                                                                                                                                    TREATMENT DATE:  09/09/24 Recumbent bike L4 x4' TUG, 5xSTS and strength updates, goal review Standing T with single UE support x10 Standing with foot on wall red loop clam x5 each - very hard Hip 2-way ext and abd tied red band 2x10 each Standing march red loop above knees alt LE  HEP updates and verbal review of fall risks with layered challenges across visual, cognitive and physical  09/01/2024 Nustep x 5 min L 5 (PT present to discuss status) Heel and toe raises 10x Weight shifts 4 ways 10x with eyes closed 5x Walking on toes, walking on heels, tip toe with overhead reaches Hurdles walk forward and lateral and backward Walk with ball bounce Walk with ball bounce against the wall  Obstacle course stepping on and over objects with dual cognitive tasks Clock Yourself coordination movements with 2# UE weights 3 minutes 40 spm Discussed exercise plan following PT (she is a member of [employer]) 08/26/2024 Nustep x 5 min L 5 (PT present to discuss status) BOSU squats 2 x 10 Star drill eyes closed with slider x 5 each LE SLS with eyes closed 5 x each LE attempting 10 sec hold Cone touches 3 x 10 each LE eyes open Standing in tandem on balance pad dead lift x 10 each LE (no weight) eyes closed Marching on balance pad 2 x 10 high knees with eyes closed Rocker board x 2 min without UE support Lunges to BOSU x 10 each LE fwd and x 10 each LE lateral  08/23/2024 Nustep x 5 min L 5 (PT present  to discuss status) Mini BEST as above Clock Yourself: simple clock 50 SPM 3  min Clock Yourself Brain Games months of the year 40 spm 3 min Walking with 10# weight unilateral with cognitive dual tasks Walking with pair of 10# weights (farmers hold and shoulder hold) with dual cog tasks Box stepping forward, lateral and backward  08/17/2024 Nustep x 5 min L 5 (PT present to discuss status) Slider star drill (5 points) x 5 -6each LE (with mirror for visual feedback to maintain proper alignment) Eccentric step downs on 4 step fwd x 10 then lateral x 10 (with mirror for visual feedback to maintain proper alignment) Lateral band walks x 3 laps with yellow tband tied (10 step each way, vc's for keeping tension in the band) Static lunge x 10 B at mirror Supine bridging + hip ABD x 20 with red band, then 10 with red loop S/L clam red loop x 20 B S/L hip ABD red loop x 20 B Supine hamstring curl in bridge position: heels on 2 separate towels: out with both feet then pull one in at a time x 2 total cramped today She could bridge with S/L sllde outs x 5 R Bridge +HS curl on red plyo ball x 5 HS stretch with strap x 30 sec B Farmer carry 10# unilaterally at side and shoulder B one lap of hallway ea. March with prolonged hold 10#KB unilaterally x 10 B   PATIENT EDUCATION:  Education details: See above Person educated: Patient Education method: Explanation, Demonstration, Verbal cues, and Handouts Education comprehension: verbalized understanding, returned demonstration, and verbal cues required  HOME EXERCISE PROGRAM: Access Code: [email or web address] URL: [email or web address] Date: 09/09/2024 Prepared by: Orvil Hazen Brumett  Exercises - Standing Hamstring Stretch on Chair  - 1 x daily - 7 x weekly - 1 sets - 3 reps - 30 sec hold - Quadricep Stretch with Chair and Counter Support  - 1 x daily - 7 x weekly - 1 sets - 3 reps - 30 sec hold - Seated Figure 4 Piriformis Stretch  - 1 x daily - 7 x weekly - 1 sets - 3 reps - 30 sed hold - Seated Lateral Trunk Stretch on  Swiss Ball  - 1 x daily - 7 x weekly - 1 sets - 10 reps - 5 sec hold - Clamshell with Resistance  - 1 x daily - 3 x weekly - 2-3 sets - 10 reps - Standing Isometric Hip Abduction with Ball on Wall  - 1 x daily - 3 x weekly - 2 sets - 10 reps - 5 sec hold - Hip Hiking on Step  - 1 x daily - 3 x weekly - 2 sets - 10 reps - Wall Squat  - 1 x daily - 3 x weekly - 1 sets - 3-5 reps - 10-15 sec hold - Forward T  - 1 x daily - 7 x weekly - 2 sets - 10 reps - Standing Hip Extension with Resistance at Ankles and Counter Support  - 1 x daily - 7 x weekly - 2 sets - 10 reps - Standing Hip Abduction with Resistance at Ankles and Counter Support  - 1 x daily - 7 x weekly - 2 sets - 10 reps - Standing Marching  - 1 x daily - 7 x weekly - 2 sets - 10 reps  ASSESSMENT:  CLINICAL IMPRESSION: Tevis has met all goals.  She had no falls within episode of care and is ind with HEP.  She understands about how layering cognitive, visual and physical tasks puts her more at risk for falls.  She was able to train these and demo success without LOB over the last few sessions. She plans to continue HEP and perform gym machines at [employer].  We added some closed chain strength/balance hip strenghening today to complement the gym machines.  We also discussed using intervals on TM at gym for elevation and pace challenges.   OBJECTIVE IMPAIRMENTS: Abnormal gait, decreased balance, difficulty walking, decreased strength, increased muscle spasms, impaired flexibility, postural dysfunction, and pain.   ACTIVITY LIMITATIONS: stairs, transfers, and bed mobility  PARTICIPATION LIMITATIONS: community activity and yard work  PERSONAL FACTORS: No significant other co-morbidities are also affecting patient's functional outcome.   REHAB POTENTIAL: Excellent  CLINICAL DECISION MAKING: Stable/uncomplicated  EVALUATION COMPLEXITY: Low   GOALS: Goals reviewed with patient? Yes  SHORT TERM GOALS: Target date:  08/13/2024  Patient will be independent with initial HEP  Baseline: Goal status: MET 08/05/24  2.  Eliminate right low back pain Baseline:  Goal status: MET 08/05/24   LONG TERM GOALS: Target date: 09/10/2024  Patient to be independent with advanced HEP  Baseline:  Goal status: MET 08/26/24  2.  No falls during episode of skilled PT Baseline:  Goal status: MET 11/7  3.  Eliminate trendelenburg gait Baseline:  Goal status: MET 08/26/24  4.  Patient to be able to demonstrate SLS on each LE x 20 sec without LOB Baseline:  Goal status: In progress  5.  Patient to demonstrate 30 sec on each task of modified CTSIB with no LOB Baseline:  Goal status: MET 11/7    PLAN:  PT FREQUENCY: 1-2x/week  PT DURATION: 8 weeks  PLANNED INTERVENTIONS: 97110-Therapeutic exercises, 97530- Therapeutic activity, 97112- Neuromuscular re-education, 97535- Self Care, [id number]- Manual therapy, [phone]- Gait training, [phone]- Canalith repositioning, [id number]- Aquatic Therapy, [phone]- Electrical stimulation (unattended), [phone]- Electrical stimulation (manual), [id number]- Vasopneumatic device, 97035- Ultrasound, Patient/Family education, Balance training, Stair training, Joint mobilization, Spinal mobilization, Vestibular training, DME instructions, Cryotherapy, and Moist heat  PLAN FOR NEXT SESSION: d/c to HEP  PHYSICAL THERAPY DISCHARGE SUMMARY  Visits from Start of Care: 11  Current functional level related to goals / functional outcomes: met   Remaining deficits: See above   Education / Equipment: HEP   Patient agrees to discharge. Patient goals were met. Patient is being discharged due to meeting the stated rehab goals.   Orvil Fester, PT 09/09/24 10:55 AM  Phone: [phone] Fax: [phone]  [facility] [address], [address] [place], [place] [address] Phone # [phone] Fax [phone]

## 2024-10-10 DIAGNOSIS — M21622 Bunionette of left foot: Secondary | ICD-10-CM | POA: Diagnosis not present

## 2024-10-10 DIAGNOSIS — M21611 Bunion of right foot: Secondary | ICD-10-CM | POA: Diagnosis not present

## 2024-10-10 DIAGNOSIS — M2042 Other hammer toe(s) (acquired), left foot: Secondary | ICD-10-CM | POA: Diagnosis not present

## 2024-10-13 ENCOUNTER — Other Ambulatory Visit (HOSPITAL_COMMUNITY): Payer: Self-pay

## 2024-10-13 ENCOUNTER — Other Ambulatory Visit: Payer: Self-pay

## 2024-10-13 MED ORDER — ESTRADIOL 0.05 MG/24HR TD PTTW
1.0000 | MEDICATED_PATCH | TRANSDERMAL | 3 refills | Status: AC
  Filled 2024-10-13: qty 24, 84d supply, fill #0

## 2024-10-18 ENCOUNTER — Ambulatory Visit: Admitting: Neurology

## 2024-10-18 ENCOUNTER — Encounter: Payer: Self-pay | Admitting: Neurology

## 2024-10-18 VITALS — BP 103/65 | HR 62 | Ht 60.0 in | Wt 136.0 lb

## 2024-10-18 DIAGNOSIS — R2681 Unsteadiness on feet: Secondary | ICD-10-CM | POA: Diagnosis not present

## 2024-10-18 NOTE — Progress Notes (Signed)
 Follow-up Visit   Date: 10/18/2024    Taylor Holloway MRN: [id number] DOB: 09/01/1961    Taylor Holloway is a 63 y.o. right-handed Caucasian female  returning to the clinic for follow-up of gait imbalance.  The patient was accompanied to the clinic by husband who also provides collateral information.    IMPRESSION/PLAN: Assessment & Plan Gait imbalance and history of falls, improved with physical therapy. No falls since July. MRI brain and lumbar spine shows no nerve structural disease. - Continue home exercises per physical therapy. - Monitor for symptom changes or regression. - Contact provider if symptoms worsen or new issues arise.  --------------------------------------------- History of present illness: Over the past 6 months, she has several falls which predominately have involved steps.  She reports missing steps or miscalculating the depth of the step which caused her to call.  Husband also reports that she tends to be half an inch off when trying to placing items on the counter or in the dish washer.  She has more difficulty balancing on her left leg and feels that the left leg can be a little weaker. She denies numbness/tingling or low back pain.   She denies imbalance on flat ground.     UPDATE 10/18/2024: Discussed the use of AI scribe software for clinical note transcription with the patient, who gave verbal consent to proceed.  History of Present Illness Taylor Holloway is a 63 year old female who presents for follow-up after completing physical therapy for balance issues and falls.  She found physical therapy beneficial, particularly in increasing her awareness of factors contributing to her falls. Since completing therapy, she has not experienced any falls, with the last incident occurring in July 2025. No new balance problems, stiffness, numbness, tingling, or weakness. She has been diligent in maintaining her home exercise regimen post-therapy.  An MRI of  her lower back was performed and did not show any structural impingement.      Medications:  Medications Ordered Prior to Encounter[1]  Allergies: Allergies[2]  Vital Signs:  BP 103/65   Pulse 62   Ht 5' (1.524 m)   Wt 136 lb (61.7 kg)   SpO2 98%   BMI 26.56 kg/m   Neurological Exam: MENTAL STATUS including orientation to time, place, person, recent and remote memory, attention span and concentration, language, and fund of knowledge is normal.  Speech is not dysarthric.  CRANIAL NERVES:  No visual field defects.  Pupils equal round and reactive to light.  Normal conjugate, extra-ocular eye movements in all directions of gaze.  No ptosis.  Face is symmetric. Palate elevates symmetrically.  Tongue is midline.  MOTOR:  Motor strength is 5/5 in all extremities.  No atrophy, fasciculations or abnormal movements.  No pronator drift.  Tone is normal.    MSRs:  Reflexes are 2+/4 throughout.  SENSORY:  Intact to vibration throughout.  COORDINATION/GAIT:  Normal finger-to- nose-finger.  Intact rapid alternating movements bilaterally (improved).  Gait narrow based and stable.   Data: MRI lumbar spine 07/27/2024: 1. Lumbar spondylosis and degenerative disc disease, but without observed impingement. 2. Dextroconvex lumbar scoliosis with rotary component.  MRI brain wo contrast 05/23/2024: 1. No acute intracranial abnormality. 2. 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     Thank you for allowing me to participate in patient's care.  If I can answer any additional questions, I would  be pleased to do so.    Sincerely,    Azul Coffie K. Tobie, DO      [1]  Current Outpatient Medications on File Prior to Visit  Medication Sig Dispense Refill   azithromycin  (ZITHROMAX ) 500 MG tablet Take 1 tablet (500 mg total) by mouth daily. 3 tablet 0    cholecalciferol (VITAMIN D3) 25 MCG (1000 UNIT) tablet Take 1,000 Units by mouth daily.     estradiol  (DOTTI ) 0.075 MG/24HR APPLY 1 PATCH TO SKIN TWICE WEEKLY 24 patch 3   estradiol  (VIVELLE -DOT) 0.05 MG/24HR patch Place 1 patch (0.05 mg total) onto the skin 2 (two) times a week. 24 patch 3   estradiol  (VIVELLE -DOT) 0.1 MG/24HR Place 1 patch onto the skin 2 (two) times a week. Changes patch on Tuesdays and Saturdays      fluocinonide  (LIDEX ) 0.05 % external solution Apply to the scalp once daily as needed. 60 mL 1   Multiple Vitamin (MULITIVITAMIN WITH MINERALS) TABS Take 1 tablet by mouth daily.       Multiple Vitamins-Minerals (HAIR/SKIN/NAILS) TABS Take by mouth.     polyethylene glycol-electrolytes (NULYTELY) 420 g solution use as directed 4000 mL 0   VITAMIN E PO Take by mouth.     chlorpheniramine-HYDROcodone  (TUSSIONEX PENNKINETIC ER) 10-8 MG/5ML LQCR Take 5 mLs by mouth every 12 (twelve) hours as needed for cough. (Patient not taking: Reported on 10/18/2024) 115 mL 0   No current facility-administered medications on file prior to visit.  [2] No Known Allergies
